# Patient Record
Sex: Female | Born: 1980 | Race: Asian | Hispanic: No | Marital: Married | Smoking: Current some day smoker
Health system: Southern US, Community
[De-identification: ages and names within clinical notes are randomized; demographics above are authoritative.]

## PROBLEM LIST (undated history)

## (undated) DIAGNOSIS — O1495 Unspecified pre-eclampsia, complicating the puerperium: Secondary | ICD-10-CM

## (undated) DIAGNOSIS — I10 Essential (primary) hypertension: Secondary | ICD-10-CM

## (undated) DIAGNOSIS — T8859XA Other complications of anesthesia, initial encounter: Secondary | ICD-10-CM

## (undated) DIAGNOSIS — T4145XA Adverse effect of unspecified anesthetic, initial encounter: Secondary | ICD-10-CM

## (undated) HISTORY — DX: Adverse effect of unspecified anesthetic, initial encounter: T41.45XA

## (undated) HISTORY — DX: Essential (primary) hypertension: I10

## (undated) HISTORY — DX: Unspecified pre-eclampsia, complicating the puerperium: O14.95

## (undated) HISTORY — DX: Other complications of anesthesia, initial encounter: T88.59XA

---

## 2008-03-02 ENCOUNTER — Emergency Department (HOSPITAL_BASED_OUTPATIENT_CLINIC_OR_DEPARTMENT_OTHER): Admission: EM | Admit: 2008-03-02 | Discharge: 2008-03-02 | Payer: Self-pay | Admitting: Emergency Medicine

## 2009-08-30 DIAGNOSIS — N809 Endometriosis, unspecified: Secondary | ICD-10-CM | POA: Insufficient documentation

## 2011-01-08 LAB — URINALYSIS, ROUTINE W REFLEX MICROSCOPIC
Hgb urine dipstick: NEGATIVE
Ketones, ur: NEGATIVE
Specific Gravity, Urine: 1.017
Urobilinogen, UA: 0.2

## 2011-01-08 LAB — DIFFERENTIAL
Basophils Relative: 1
Eosinophils Relative: 1
Lymphocytes Relative: 33
Monocytes Absolute: 0.5
Monocytes Relative: 6
Neutro Abs: 5.1

## 2011-01-08 LAB — CBC
Hemoglobin: 13.2
MCV: 96
Platelets: 205
WBC: 8.5

## 2011-01-08 LAB — POCT I-STAT 3, ART BLOOD GAS (G3+)
Acid-Base Excess: 1
pH, Arterial: 7.574 — ABNORMAL HIGH

## 2011-01-08 LAB — COMPREHENSIVE METABOLIC PANEL
ALT: <6
Alkaline Phosphatase: 35 — ABNORMAL LOW
BUN: 14
CO2: 24
Creatinine, Ser: 0.6
Glucose, Bld: 78
Total Bilirubin: 0.7

## 2011-01-08 LAB — GLUCOSE, CAPILLARY: Glucose-Capillary: 112 — ABNORMAL HIGH

## 2013-06-21 ENCOUNTER — Encounter: Payer: Self-pay | Admitting: Physician Assistant

## 2013-06-21 ENCOUNTER — Encounter (INDEPENDENT_AMBULATORY_CARE_PROVIDER_SITE_OTHER): Payer: Self-pay

## 2013-06-21 ENCOUNTER — Ambulatory Visit (INDEPENDENT_AMBULATORY_CARE_PROVIDER_SITE_OTHER): Payer: Federal, State, Local not specified - PPO | Admitting: Physician Assistant

## 2013-06-21 VITALS — BP 126/87 | HR 83 | Ht 67.0 in | Wt 189.0 lb

## 2013-06-21 DIAGNOSIS — Z131 Encounter for screening for diabetes mellitus: Secondary | ICD-10-CM

## 2013-06-21 DIAGNOSIS — Z6829 Body mass index (BMI) 29.0-29.9, adult: Secondary | ICD-10-CM

## 2013-06-21 DIAGNOSIS — O1003 Pre-existing essential hypertension complicating the puerperium: Secondary | ICD-10-CM | POA: Insufficient documentation

## 2013-06-21 DIAGNOSIS — Z13220 Encounter for screening for lipoid disorders: Secondary | ICD-10-CM

## 2013-06-21 DIAGNOSIS — R7301 Impaired fasting glucose: Secondary | ICD-10-CM

## 2013-06-21 DIAGNOSIS — R635 Abnormal weight gain: Secondary | ICD-10-CM

## 2013-06-21 DIAGNOSIS — IMO0001 Reserved for inherently not codable concepts without codable children: Secondary | ICD-10-CM

## 2013-06-21 MED ORDER — PHENTERMINE HCL 15 MG PO CAPS: 15.0000 mg | ORAL_CAPSULE | ORAL | Status: DC

## 2013-06-21 NOTE — Patient Instructions (Signed)
My fitness pal Lose app

## 2013-06-21 NOTE — Progress Notes (Signed)
   Subjective:    Patient ID: Morgan Miller, female    DOB: 08/24/1980, 33 y.o.   MRN: [id number]  HPI Pt is a 33 yo female who presents to the clinic to establish care.  . Active Ambulatory Problems    Diagnosis Date Noted  . Essential hypertension-postpartum 06/21/2013  . Abnormal weight gain 06/21/2013   Resolved Ambulatory Problems    Diagnosis Date Noted  . No Resolved Ambulatory Problems   Past Medical History  Diagnosis Date  . Hypertension    . History   Social History  . Marital Status: Married    Spouse Name: N/A    Number of Children: N/A  . Years of Education: N/A   Occupational History  . Not on file.   Social History Main Topics  . Smoking status: Current Every Day Smoker  . Smokeless tobacco: Not on file  . Alcohol Use: No  . Drug Use: Not on file  . Sexual Activity: Yes   Other Topics Concern  . Not on file   Social History Narrative  . No narrative on file   . Family History  Problem Relation Age of Onset  . Hyperlipidemia Mother   . Diabetes Father   . Hyperlipidemia Father   . Hypertension Father   . Alcohol abuse Paternal Uncle   . Heart attack Paternal Grandfather    Pt wants to discuss weight gain since last pregnancy over a year ago. Her whole life she has been 125lbs and never had any weight issues. She gained 60lbs with first pregnancy but lost it within a year. This last pregnancy gained 80lbs and has almost not lost any of it in over a year. She admits to not counting calories and does not exercise. She does try to eat fruit, veggies, yogurt but admits to also eating a lot of rice. She has a lot of sugar cravings. Not really fatigued. She has energy. She does smoke everyday. She did have post-partum high blood pressure but has since resolved.    Review of Systems  All other systems reviewed and are negative.       Objective:   Physical Exam  Constitutional: She is oriented to person, place, and time. She appears well-developed  and well-nourished.  Overweight  HENT:  Head: Normocephalic and atraumatic.  Neck: Normal range of motion. Neck supple. No thyromegaly present.  Cardiovascular: Normal rate, regular rhythm and normal heart sounds.   Pulmonary/Chest: Effort normal and breath sounds normal.  Neurological: She is alert and oriented to person, place, and time.  Psychiatric: She has a normal mood and affect. Her behavior is normal.          Assessment & Plan:  Abnormal weight gain- Will check TSH. BMI 29.9. Pt does have risk factor of post-partium HTN. Will start phentermine daily at half dose. Discussed side effects of HR, BP, insomnia. Encouraged loading my fitness pal to log calories. Discussed nutritionist if needed. Add at least 150minutes of cardio into weekly routine. Follow up in one month. May consider phentermine increase if tolerating.     Screening labs ordered.

## 2013-06-25 LAB — COMPLETE METABOLIC PANEL WITH GFR
ALBUMIN: 4.8 g/dL (ref 3.5–5.2)
ALK PHOS: 85 U/L (ref 39–117)
ALT: 14 U/L (ref 0–35)
AST: 12 U/L (ref 0–37)
BILIRUBIN TOTAL: 0.5 mg/dL (ref 0.2–1.2)
BUN: 10 mg/dL (ref 6–23)
CO2: 25 mEq/L (ref 19–32)
Calcium: 10 mg/dL (ref 8.4–10.5)
Chloride: 104 mEq/L (ref 96–112)
Creat: 0.59 mg/dL (ref 0.50–1.10)
GFR, Est African American: >89 mL/min
GLUCOSE: 102 mg/dL — AB (ref 70–99)
POTASSIUM: 4.5 meq/L (ref 3.5–5.3)
Sodium: 138 mEq/L (ref 135–145)
TOTAL PROTEIN: 7.4 g/dL (ref 6.0–8.3)

## 2013-06-25 LAB — LIPID PANEL
Cholesterol: 193 mg/dL (ref 0–200)
HDL: 48 mg/dL (ref >39)
LDL Cholesterol: 121 mg/dL — ABNORMAL HIGH (ref 0–99)
Total CHOL/HDL Ratio: 4 Ratio
Triglycerides: 122 mg/dL (ref <150)
VLDL: 24 mg/dL (ref 0–40)

## 2013-06-25 LAB — VITAMIN B12: Vitamin B-12: 356 pg/mL (ref 211–911)

## 2013-06-25 LAB — TSH: TSH: 1.237 u[IU]/mL (ref 0.350–4.500)

## 2013-06-25 LAB — T4, FREE: Free T4: 1.43 ng/dL (ref 0.80–1.80)

## 2013-06-25 LAB — T3, FREE: T3 FREE: 3 pg/mL (ref 2.3–4.2)

## 2013-06-26 LAB — VITAMIN D 25 HYDROXY (VIT D DEFICIENCY, FRACTURES): Vit D, 25-Hydroxy: 30 ng/mL (ref 30–89)

## 2013-06-28 NOTE — Addendum Note (Signed)
Addended by: Donne AnonBENDER, Delphina Schum L on: 06/28/2013 11:28 AM   Modules accepted: Orders

## 2013-06-29 ENCOUNTER — Encounter: Payer: Self-pay | Admitting: Physician Assistant

## 2013-06-29 DIAGNOSIS — R7303 Prediabetes: Secondary | ICD-10-CM | POA: Insufficient documentation

## 2013-06-29 LAB — HEMOGLOBIN A1C
Hgb A1c MFr Bld: 6.1 % — ABNORMAL HIGH (ref <5.7)
MEAN PLASMA GLUCOSE: 128 mg/dL — AB (ref <117)

## 2013-07-01 ENCOUNTER — Encounter: Payer: Self-pay | Admitting: Physician Assistant

## 2013-07-02 ENCOUNTER — Other Ambulatory Visit: Payer: Self-pay | Admitting: *Deleted

## 2013-07-02 MED ORDER — PHENTERMINE HCL 15 MG PO CAPS: 15.0000 mg | ORAL_CAPSULE | ORAL | Status: DC

## 2013-07-09 ENCOUNTER — Other Ambulatory Visit: Payer: Self-pay | Admitting: Physician Assistant

## 2013-07-23 ENCOUNTER — Ambulatory Visit: Payer: Federal, State, Local not specified - PPO | Admitting: Physician Assistant

## 2013-07-23 ENCOUNTER — Encounter: Payer: Self-pay | Admitting: Physician Assistant

## 2013-07-23 ENCOUNTER — Ambulatory Visit (INDEPENDENT_AMBULATORY_CARE_PROVIDER_SITE_OTHER): Payer: Federal, State, Local not specified - PPO | Admitting: Physician Assistant

## 2013-07-23 VITALS — BP 113/76 | HR 87 | Ht 67.0 in | Wt 176.0 lb

## 2013-07-23 DIAGNOSIS — R635 Abnormal weight gain: Secondary | ICD-10-CM

## 2013-07-23 MED ORDER — PHENTERMINE HCL 15 MG PO CAPS: ORAL_CAPSULE | ORAL | Status: DC

## 2013-07-23 NOTE — Patient Instructions (Signed)
Pre-natal vitamins with DHA.

## 2013-07-23 NOTE — Progress Notes (Signed)
   Subjective:    Patient ID: Morgan Miller, female    DOB: 04/01/1981, 33 y.o.   MRN: [id number]  HPI Pt presents to the clinic to follow up on obesity. She has been on phentermine for 1 month. She denies any insomnia, tachycardia, palpitations. She has lost 12lbs. She is very happy with results. She has made a lot of diet changes with reduction of bread and carbs, she walks 45 minutes a day. She would like to get pregnant in the near future.    Review of Systems     Objective:   Physical Exam  Constitutional: She is oriented to person, place, and time. She appears well-developed and well-nourished.  HENT:  Head: Normocephalic and atraumatic.  Cardiovascular: Normal rate, regular rhythm and normal heart sounds.   Pulmonary/Chest: Effort normal and breath sounds normal.  Neurological: She is alert and oriented to person, place, and time.  Psychiatric: She has a normal mood and affect. Her behavior is normal.          Assessment & Plan:  Abnormal weight gain/overweight- Will refill phentermine for one more month. Continue diet and exercise. Discussed not to get pregnant while on phentermine. Suggest to wait 1 month after stopping medication. I also recommended to start prenatal vitamins now if planning to get pregnant with in the next 3 months. Pt would like to lose 10 more pounds before getting pregnant. BP and heart rate looks great.

## 2013-07-27 ENCOUNTER — Encounter: Payer: Self-pay | Admitting: Physician Assistant

## 2013-07-27 ENCOUNTER — Other Ambulatory Visit: Payer: Self-pay | Admitting: *Deleted

## 2013-07-27 MED ORDER — PHENTERMINE HCL 15 MG PO CAPS: ORAL_CAPSULE | ORAL | Status: DC

## 2013-08-05 ENCOUNTER — Encounter: Payer: Self-pay | Admitting: Physician Assistant

## 2013-08-27 ENCOUNTER — Telehealth: Payer: Self-pay

## 2013-08-27 NOTE — Telephone Encounter (Signed)
Patient called and left a message on nurse line asking for a return call.   Returned Call: Left message asking patient to call back.  

## 2013-09-30 ENCOUNTER — Ambulatory Visit (INDEPENDENT_AMBULATORY_CARE_PROVIDER_SITE_OTHER): Payer: Federal, State, Local not specified - PPO

## 2013-09-30 ENCOUNTER — Ambulatory Visit (INDEPENDENT_AMBULATORY_CARE_PROVIDER_SITE_OTHER): Payer: Federal, State, Local not specified - PPO | Admitting: Obstetrics & Gynecology

## 2013-09-30 ENCOUNTER — Encounter: Payer: Self-pay | Admitting: Obstetrics & Gynecology

## 2013-09-30 VITALS — BP 124/78 | HR 88 | Wt 173.0 lb

## 2013-09-30 DIAGNOSIS — O021 Missed abortion: Secondary | ICD-10-CM

## 2013-09-30 DIAGNOSIS — N912 Amenorrhea, unspecified: Secondary | ICD-10-CM

## 2013-09-30 DIAGNOSIS — O039 Complete or unspecified spontaneous abortion without complication: Secondary | ICD-10-CM

## 2013-09-30 MED ORDER — PROMETHAZINE HCL 25 MG PO TABS: 25.0000 mg | ORAL_TABLET | Freq: Four times a day (QID) | ORAL | Status: DC | PRN

## 2013-09-30 MED ORDER — CODEINE SULFATE 30 MG PO TABS
30.0000 mg | ORAL_TABLET | Freq: Four times a day (QID) | ORAL | Status: DC | PRN
Start: 2013-09-30 — End: 2013-10-01

## 2013-09-30 MED ORDER — IBUPROFEN 800 MG PO TABS: 800.0000 mg | ORAL_TABLET | Freq: Four times a day (QID) | ORAL | Status: DC | PRN

## 2013-09-30 MED ORDER — MISOPROSTOL 200 MCG PO TABS: ORAL_TABLET | ORAL | Status: DC

## 2013-09-30 NOTE — Progress Notes (Signed)
Pt here today for NOB intake for a planned pregnancy  Last pap smear 2 years ago

## 2013-10-01 ENCOUNTER — Encounter: Payer: Self-pay | Admitting: Family Medicine

## 2013-10-01 ENCOUNTER — Encounter: Payer: Self-pay | Admitting: Obstetrics & Gynecology

## 2013-10-01 ENCOUNTER — Ambulatory Visit (INDEPENDENT_AMBULATORY_CARE_PROVIDER_SITE_OTHER): Payer: Federal, State, Local not specified - PPO | Admitting: Family Medicine

## 2013-10-01 ENCOUNTER — Other Ambulatory Visit: Payer: Federal, State, Local not specified - PPO

## 2013-10-01 VITALS — BP 125/86 | HR 90 | Temp 98.0°F | Wt 178.0 lb

## 2013-10-01 DIAGNOSIS — F4321 Adjustment disorder with depressed mood: Secondary | ICD-10-CM

## 2013-10-01 LAB — CBC
HEMATOCRIT: 40.6 % (ref 36.0–46.0)
HEMOGLOBIN: 13.7 g/dL (ref 12.0–15.0)
MCH: 30.6 pg (ref 26.0–34.0)
MCHC: 33.7 g/dL (ref 30.0–36.0)
MCV: 90.8 fL (ref 78.0–100.0)
Platelets: 363 10*3/uL (ref 150–400)
RBC: 4.47 MIL/uL (ref 3.87–5.11)
RDW: 14.2 % (ref 11.5–15.5)
WBC: 10.6 10*3/uL — ABNORMAL HIGH (ref 4.0–10.5)

## 2013-10-01 MED ORDER — ALPRAZOLAM 0.25 MG PO TABS: 0.2500 mg | ORAL_TABLET | Freq: Two times a day (BID) | ORAL | Status: DC | PRN

## 2013-10-01 NOTE — Patient Instructions (Signed)
Call me if you decide to do counseling.

## 2013-10-01 NOTE — Progress Notes (Signed)
Pt has sure LMP and has regular menses.  Pt trying to conceive.  Pt took ovulation predictor then had positive pregnancy test 2+ weeks after ovulation.  Pt having some nausea.  No bleeding.  US shows 6 week 6 day Missed abortion (report in Epic).  Pt counseled about cytotec and would like to proceed.  She does not want a D&C at this time.  Protocol initiated.  Need RH and CBC.  Codeine for pain as allergic to Tylenol.   Pt to come June 26th for labs then proceed with cytotec 800mg  pv.   Total time spent with patient 45 minutes.

## 2013-10-01 NOTE — Progress Notes (Signed)
Subjective:    Patient ID: Morgan Miller, female    DOB: 08/15/1980, 33 y.o.   MRN: [id number]  HPI She is [redacted] weeks pregnant and found out yesterday that she is miscarrying.  She is actually picking up the cytotec today.  She just woke up feeling really shakey and "lost" today. Says can't stop shaking.  She does have a 34 year old son.  She was unable to sleep last night. No worsening or alleviating factors.  She is veyr tearful. Family is supportive.  [facility].    Review of Systems  BP 125/86  Pulse 90  Temp(Src) 98 F (36.7 C)  Wt 178 lb (80.74 kg)  LMP 08/08/2013    Allergies  Allergen Reactions  . Acetaminophen Shortness Of Breath  . Codeine Nausea Only    Past Medical History  Diagnosis Date  . Hypertension   . Complication of anesthesia     epidural caused syncope  . Preeclampsia in postpartum period     Past Surgical History  Procedure Laterality Date  . Cesarean section      History   Social History  . Marital Status: Married    Spouse Name: N/A    Number of Children: N/A  . Years of Education: N/A   Occupational History  . supervisor    Social History Main Topics  . Smoking status: Former Smoker    Quit date: 08/30/2013  . Smokeless tobacco: Not on file  . Alcohol Use: No  . Drug Use: Not on file  . Sexual Activity: Yes   Other Topics Concern  . Not on file   Social History Narrative  . No narrative on file    Family History  Problem Relation Age of Onset  . Hyperlipidemia Mother   . Diabetes Father   . Hyperlipidemia Father   . Hypertension Father   . Alcohol abuse Paternal Uncle   . Heart attack Paternal Grandfather     Outpatient Encounter Prescriptions as of 10/01/2013  Medication Sig  . ibuprofen (ADVIL,MOTRIN) 800 MG tablet Take 1 tablet (800 mg total) by mouth every 6 (six) hours as needed.  . ALPRAZolam (XANAX) 0.25 MG tablet Take 1 tablet (0.25 mg total) by mouth 2 (two) times daily as needed for  anxiety or sleep.  . [DISCONTINUED] codeine 30 MG tablet Take 1 tablet (30 mg total) by mouth every 6 (six) hours as needed.  . [DISCONTINUED] misoprostol (CYTOTEC) 200 MCG tablet Insert four tablets vaginally the night prior to your appointment  . [DISCONTINUED] Prenatal Multivit-Min-Fe-FA (PRENATAL VITAMINS PO) Take by mouth.  . [DISCONTINUED] promethazine (PHENERGAN) 25 MG tablet Take 1 tablet (25 mg total) by mouth every 6 (six) hours as needed for nausea or vomiting.          Objective:   Physical Exam  Constitutional: She is oriented to person, place, and time. She appears well-developed and well-nourished.  HENT:  Head: Normocephalic and atraumatic.  Eyes: Conjunctivae and EOM are normal.  Cardiovascular: Normal rate.   Pulmonary/Chest: Effort normal.  Neurological: She is alert and oriented to person, place, and time.  Skin: Skin is dry. No pallor.  Psychiatric: She has a normal mood and affect. Her behavior is normal.          Assessment & Plan:  Acute grief reaction from failed pregnancy-discussed different options including counseling/therapy versus antidepressant versus when necessary medication. Patient says she will definitely think about counseling and is very open to  it. She would like to call me back after she had chance to think about it. We did discuss that I put her on SSRI daily that we would need to wean off before she tries to become pregnant again. She would like to just have a rescue medication to use as needed. We discussed that she can also use this at night to help her sleep. It can be habit-forming and discuss this as well and to use it judiciously. We'll give her a small quantity of alprazolam 0.25 mg. #15 tabs with no refills.  Failed pregnancy-keep followup appointment with OB/GYN.

## 2013-10-02 LAB — RH TYPE: RH TYPE: POSITIVE

## 2013-10-06 ENCOUNTER — Encounter: Payer: Self-pay | Admitting: *Deleted

## 2013-10-06 ENCOUNTER — Telehealth: Payer: Self-pay | Admitting: *Deleted

## 2013-10-06 DIAGNOSIS — O039 Complete or unspecified spontaneous abortion without complication: Secondary | ICD-10-CM

## 2013-10-06 MED ORDER — HYDROCODONE-IBUPROFEN 5-200 MG PO TABS: ORAL_TABLET | ORAL | Status: DC

## 2013-10-06 NOTE — Telephone Encounter (Signed)
Pt called requesting something other than Codeine for pain as there ar no pharmacies that she has called that carries just Codeine and she is allergic to acetaminophen

## 2013-11-19 ENCOUNTER — Telehealth: Payer: Self-pay | Admitting: *Deleted

## 2013-11-19 NOTE — Telephone Encounter (Signed)
Pt called in stating she had a regular menstrual cycle on 11/06/13 and on 11/17/13 had intercourse and noticed brown vaginal discharge with tissue. Pt states she has bloating today like she is going to have another cycle. I verified that pt has not have fever or any other Sx of infection. Explained to pt that if she noticed bright red bleeding and it is not time for cycle or she begins to have Sx of infection to go to [facility] for evaluation. Pt expressed understanding.

## 2013-11-29 ENCOUNTER — Ambulatory Visit: Payer: Federal, State, Local not specified - PPO | Admitting: Advanced Practice Midwife

## 2013-12-02 ENCOUNTER — Ambulatory Visit: Payer: Federal, State, Local not specified - PPO | Admitting: Obstetrics & Gynecology

## 2014-02-07 ENCOUNTER — Encounter: Payer: Self-pay | Admitting: Family Medicine

## 2014-04-12 ENCOUNTER — Telehealth: Payer: Self-pay | Admitting: *Deleted

## 2014-04-12 NOTE — Telephone Encounter (Signed)
Returned a call to patient but got her voicemail.  Instructed pt to call office if she still needed to speak with me.

## 2014-05-03 ENCOUNTER — Ambulatory Visit (INDEPENDENT_AMBULATORY_CARE_PROVIDER_SITE_OTHER): Payer: Federal, State, Local not specified - PPO | Admitting: Obstetrics & Gynecology

## 2014-05-03 ENCOUNTER — Encounter: Payer: Self-pay | Admitting: Obstetrics & Gynecology

## 2014-05-03 VITALS — BP 131/88 | HR 102 | Resp 16 | Ht 67.0 in | Wt 181.0 lb

## 2014-05-03 DIAGNOSIS — N644 Mastodynia: Secondary | ICD-10-CM

## 2014-05-03 DIAGNOSIS — F419 Anxiety disorder, unspecified: Secondary | ICD-10-CM | POA: Diagnosis not present

## 2014-05-03 LAB — POCT URINE PREGNANCY: PREG TEST UR: NEGATIVE

## 2014-05-03 NOTE — Progress Notes (Signed)
   Subjective:    Patient ID: Morgan Miller, female    DOB: 12/16/1980, 34 y.o.   MRN: [id number]  HPI  34 yo MP1 (35 yo son) who is here today because she is trying to get pregnancy since 9/15. She has been pregnancy twice and had no problem conceiving.  She is taking her PNVs all year. She is having sex and OP kits and is getting a positive surge every month.  Her biggest concern is that she may not be "hormonally balanced " to conceive. However, she has monthly periods. She has recurring symptoms of pregnancy including breast tenderness.  Review of Systems     Objective:   Physical Exam Tearful, sad young lady, some anxiety Breathing normally Neruo- intact UPT- negative       Assessment & Plan:  Reassurance given regarding her chances for conception in the next 12 months I did tell her that I am much more concerned about her apparent anxiety regarding this situation I have recommended that she see Morgan Miller in the near future about this. Check TSH today.

## 2014-05-04 ENCOUNTER — Telehealth: Payer: Self-pay | Admitting: *Deleted

## 2014-05-04 LAB — TSH: TSH: 2.142 u[IU]/mL (ref 0.350–4.500)

## 2014-05-04 NOTE — Telephone Encounter (Signed)
Patient notified of normal TSH levels

## 2014-05-25 ENCOUNTER — Telehealth: Payer: Self-pay | Admitting: Family Medicine

## 2014-05-25 DIAGNOSIS — R509 Fever, unspecified: Secondary | ICD-10-CM

## 2014-05-25 MED ORDER — AZITHROMYCIN 250 MG PO TABS: ORAL_TABLET | ORAL | Status: AC

## 2014-05-25 NOTE — Telephone Encounter (Signed)
Husband was Rxed zpack today, providing one for wife

## 2014-06-01 ENCOUNTER — Ambulatory Visit (INDEPENDENT_AMBULATORY_CARE_PROVIDER_SITE_OTHER): Payer: Federal, State, Local not specified - PPO | Admitting: Physician Assistant

## 2014-06-01 ENCOUNTER — Encounter: Payer: Self-pay | Admitting: Physician Assistant

## 2014-06-01 VITALS — BP 125/74 | HR 108 | Temp 98.1°F | Ht 67.0 in | Wt 181.0 lb

## 2014-06-01 DIAGNOSIS — R6883 Chills (without fever): Secondary | ICD-10-CM

## 2014-06-01 DIAGNOSIS — B349 Viral infection, unspecified: Secondary | ICD-10-CM

## 2014-06-01 DIAGNOSIS — J029 Acute pharyngitis, unspecified: Secondary | ICD-10-CM

## 2014-06-01 DIAGNOSIS — R52 Pain, unspecified: Secondary | ICD-10-CM | POA: Diagnosis not present

## 2014-06-01 DIAGNOSIS — R509 Fever, unspecified: Secondary | ICD-10-CM | POA: Diagnosis not present

## 2014-06-01 DIAGNOSIS — J069 Acute upper respiratory infection, unspecified: Secondary | ICD-10-CM

## 2014-06-01 LAB — POCT RAPID STREP A (OFFICE): Rapid Strep A Screen: NEGATIVE

## 2014-06-01 LAB — POCT INFLUENZA A/B
Influenza A, POC: NEGATIVE
Influenza B, POC: NEGATIVE

## 2014-06-01 NOTE — Patient Instructions (Signed)
Upper Respiratory Infection, Adult An upper respiratory infection (URI) is also sometimes known as the common cold. The upper respiratory tract includes the nose, sinuses, throat, trachea, and bronchi. Bronchi are the airways leading to the lungs. Most people improve within 1 week, but symptoms can last up to 2 weeks. A residual cough may last even longer.  CAUSES Many different viruses can infect the tissues lining the upper respiratory tract. The tissues become irritated and inflamed and often become very moist. Mucus production is also common. A cold is contagious. You can easily spread the virus to others by oral contact. This includes kissing, sharing a glass, coughing, or sneezing. Touching your mouth or nose and then touching a surface, which is then touched by another person, can also spread the virus. SYMPTOMS  Symptoms typically develop 1 to 3 days after you come in contact with a cold virus. Symptoms vary from person to person. They may include:  Runny nose.  Sneezing.  Nasal congestion.  Sinus irritation.  Sore throat.  Loss of voice (laryngitis).  Cough.  Fatigue.  Muscle aches.  Loss of appetite.  Headache.  Low-grade fever. DIAGNOSIS  You might diagnose your own cold based on familiar symptoms, since most people get a cold 2 to 3 times a year. Your caregiver can confirm this based on your exam. Most importantly, your caregiver can check that your symptoms are not due to another disease such as strep throat, sinusitis, pneumonia, asthma, or epiglottitis. Blood tests, throat tests, and X-rays are not necessary to diagnose a common cold, but they may sometimes be helpful in excluding other more serious diseases. Your caregiver will decide if any further tests are required. RISKS AND COMPLICATIONS  You may be at risk for a more severe case of the common cold if you smoke cigarettes, have chronic heart disease (such as heart failure) or lung disease (such as asthma), or if  you have a weakened immune system. The very young and very old are also at risk for more serious infections. Bacterial sinusitis, middle ear infections, and bacterial pneumonia can complicate the common cold. The common cold can worsen asthma and chronic obstructive pulmonary disease (COPD). Sometimes, these complications can require emergency medical care and may be life-threatening. PREVENTION  The best way to protect against getting a cold is to practice good hygiene. Avoid oral or hand contact with people with cold symptoms. Wash your hands often if contact occurs. There is no clear evidence that vitamin C, vitamin E, echinacea, or exercise reduces the chance of developing a cold. However, it is always recommended to get plenty of rest and practice good nutrition. TREATMENT  Treatment is directed at relieving symptoms. There is no cure. Antibiotics are not effective, because the infection is caused by a virus, not by bacteria. Treatment may include:  Increased fluid intake. Sports drinks offer valuable electrolytes, sugars, and fluids.  Breathing heated mist or steam (vaporizer or shower).  Eating chicken soup or other clear broths, and maintaining good nutrition.  Getting plenty of rest.  Using gargles or lozenges for comfort.  Controlling fevers with ibuprofen or acetaminophen as directed by your caregiver.  Increasing usage of your inhaler if you have asthma. Zinc gel and zinc lozenges, taken in the first 24 hours of the common cold, can shorten the duration and lessen the severity of symptoms. Pain medicines may help with fever, muscle aches, and throat pain. A variety of non-prescription medicines are available to treat congestion and runny nose. Your caregiver   can make recommendations and may suggest nasal or lung inhalers for other symptoms.  HOME CARE INSTRUCTIONS   Only take over-the-counter or prescription medicines for pain, discomfort, or fever as directed by your  caregiver.  Use a warm mist humidifier or inhale steam from a shower to increase air moisture. This may keep secretions moist and make it easier to breathe.  Drink enough water and fluids to keep your urine clear or pale yellow.  Rest as needed.  Return to work when your temperature has returned to normal or as your caregiver advises. You may need to stay home longer to avoid infecting others. You can also use a face mask and careful hand washing to prevent spread of the virus. SEEK MEDICAL CARE IF:   After the first few days, you feel you are getting worse rather than better.  You need your caregiver's advice about medicines to control symptoms.  You develop chills, worsening shortness of breath, or brown or red sputum. These may be signs of pneumonia.  You develop yellow or brown nasal discharge or pain in the face, especially when you bend forward. These may be signs of sinusitis.  You develop a fever, swollen neck glands, pain with swallowing, or white areas in the back of your throat. These may be signs of strep throat. SEEK IMMEDIATE MEDICAL CARE IF:   You have a fever.  You develop severe or persistent headache, ear pain, sinus pain, or chest pain.  You develop wheezing, a prolonged cough, cough up blood, or have a change in your usual mucus (if you have chronic lung disease).  You develop sore muscles or a stiff neck. Document Released: 09/18/2000 Document Revised: 06/17/2011 Document Reviewed: 06/30/2013 ExitCare Patient Information 2015 [employer]. This information is not intended to replace advice given to you by your health care provider. Make sure you discuss any questions you have with your health care provider.  

## 2014-06-01 NOTE — Progress Notes (Signed)
   Subjective:    Patient ID: Morgan Miller, female    DOB: 11/28/1980, 34 y.o.   MRN: [id number]  HPI  Patient is a 34 year old female who presents to the clinic with body aches, sore throat, ear pain, chills and fatigue for the last 3-4 days. Sunday she had a terrible headache all day long. She has ran a low-grade fever of 100 201 off and on. Monday and Tuesday she had fever, chills and body aches most of the day. She feels very weak and tired today. She did just take Tylenol and had no fever in office today. She denies any abdominal pain, nausea or constipation. She does admit that her son has had a viral illness last week with high fevers sent to the hospital. he was never treated with antibiotic. he does seem to be getting better.    Review of Systems  All other systems reviewed and are negative.      Objective:   Physical Exam  Constitutional: She is oriented to person, place, and time. She appears well-developed and well-nourished.  HENT:  Head: Normocephalic and atraumatic.  Right Ear: External ear normal.  Left Ear: External ear normal.  Mouth/Throat: Oropharynx is clear and moist.  TM slightly erythematous with no blood or pus present.  Oropharynx slightly erythematous with some postnasal drip present.  Bilateral turbinates red and swollen.  Negative for any maxillary or frontal sinus tenderness.  Eyes: Conjunctivae are normal. Right eye exhibits no discharge. Left eye exhibits no discharge.  Neck: Neck supple.  Right anterior cervical submandibular lymph node slightly swollen and tender to palpation.  Cardiovascular: Normal rate, regular rhythm and normal heart sounds.   Pulmonary/Chest: Effort normal and breath sounds normal. She has no wheezes.  Lymphadenopathy:    She has cervical adenopathy.  Neurological: She is alert and oriented to person, place, and time.  Skin: Skin is dry.  Psychiatric: She has a normal mood and affect. Her behavior is normal.           Assessment & Plan:  Viral illness/acute upper respiratory infection-rapid strep negative today and flu negative today. After physical exam there was no finding suggestive for bacterial causes. Continue to use ibuprofen and Tylenol for any fever or aches and pains. Gave handout on other symptomatic treatments. Encourage rest and hydration. Patient out of work for the rest of the week. Discussed Mucinex is starting to get more congested. May use Afrin for the next 2-5 days short-term as needed for nasal congestion. Certainly if not improving or if worsening please let us know.

## 2014-06-02 ENCOUNTER — Telehealth: Payer: Self-pay | Admitting: *Deleted

## 2014-06-02 ENCOUNTER — Telehealth: Payer: Self-pay | Admitting: Physician Assistant

## 2014-06-02 MED ORDER — AMOXICILLIN-POT CLAVULANATE 875-125 MG PO TABS: 1.0000 | ORAL_TABLET | Freq: Two times a day (BID) | ORAL | Status: DC

## 2014-06-02 NOTE — Telephone Encounter (Signed)
Patient called back request to know if she can have z-pack. Patient stated her husband's doctor here in our office kindly called pt a z-pack in and she was able to take one and her maid accidentally threw the other one out in trash and she wasn't able to fully finish the complete z-pack and that is probably why she is still sick and not better by now. Thanks

## 2014-06-02 NOTE — Telephone Encounter (Signed)
Pt returned call she took the 1st dose only the other pills got thrown away by accident. I asked her did she make Jade aware that she had taken a ABX when she came in she stated that she had forgotten to mention this to her at the time. She states that she is running a fever, sore throat on her R side and her lymph nodes are swollen and her ear and jaw is painful.   Spoke with Dr. Linford ArnoldMetheney and she will send in ABX for pt. Pt advised that if she is not feeling any better to call back. She voiced understanding.Loralee PacasBarkley, Lavel Rieman StonewallLynetta

## 2014-06-02 NOTE — Telephone Encounter (Signed)
Called pt to get more info about how she was feeling.. Was unable to speak w/pt got her vm. lvm asking for return call within the next 5 mins since our office would be closing at 5pm.Leonor Darnell, Martiniqueonya Lynetta

## 2014-06-02 NOTE — Telephone Encounter (Signed)
Pt called stating that her symptoms have gotten worse since her office visit on 06/01/14. She says she is experiencing pressure. She said that you would call an additional medication in if she began to experience pressure. Please advise.

## 2014-07-25 ENCOUNTER — Ambulatory Visit (INDEPENDENT_AMBULATORY_CARE_PROVIDER_SITE_OTHER): Payer: Federal, State, Local not specified - PPO | Admitting: Obstetrics & Gynecology

## 2014-07-25 ENCOUNTER — Encounter (INDEPENDENT_AMBULATORY_CARE_PROVIDER_SITE_OTHER): Payer: Federal, State, Local not specified - PPO | Admitting: Obstetrics & Gynecology

## 2014-07-25 ENCOUNTER — Encounter: Payer: Self-pay | Admitting: Obstetrics & Gynecology

## 2014-07-25 VITALS — BP 135/88 | HR 115 | Wt 183.0 lb

## 2014-07-25 DIAGNOSIS — F411 Generalized anxiety disorder: Secondary | ICD-10-CM

## 2014-07-25 DIAGNOSIS — N912 Amenorrhea, unspecified: Secondary | ICD-10-CM

## 2014-07-25 DIAGNOSIS — Z1151 Encounter for screening for human papillomavirus (HPV): Secondary | ICD-10-CM | POA: Diagnosis not present

## 2014-07-25 DIAGNOSIS — O2691 Pregnancy related conditions, unspecified, first trimester: Secondary | ICD-10-CM | POA: Diagnosis not present

## 2014-07-25 DIAGNOSIS — O3680X1 Pregnancy with inconclusive fetal viability, fetus 1: Secondary | ICD-10-CM

## 2014-07-25 DIAGNOSIS — Z3201 Encounter for pregnancy test, result positive: Secondary | ICD-10-CM | POA: Diagnosis not present

## 2014-07-25 DIAGNOSIS — Z124 Encounter for screening for malignant neoplasm of cervix: Secondary | ICD-10-CM | POA: Diagnosis not present

## 2014-07-26 ENCOUNTER — Telehealth: Payer: Self-pay | Admitting: *Deleted

## 2014-07-26 ENCOUNTER — Encounter: Payer: Self-pay | Admitting: Obstetrics & Gynecology

## 2014-07-26 ENCOUNTER — Encounter: Payer: Self-pay | Admitting: *Deleted

## 2014-07-26 DIAGNOSIS — O2691 Pregnancy related conditions, unspecified, first trimester: Secondary | ICD-10-CM | POA: Insufficient documentation

## 2014-07-26 LAB — HEMOGLOBIN A1C
HEMOGLOBIN A1C: 6 % — AB (ref <5.7)
Mean Plasma Glucose: 126 mg/dL — ABNORMAL HIGH (ref <117)

## 2014-07-26 LAB — HCG, QUANTITATIVE, PREGNANCY: HCG, BETA CHAIN, QUANT, S: 13362.6 m[IU]/mL

## 2014-07-26 NOTE — Telephone Encounter (Signed)
Pt notified of BHCG results.  She will repeat in 48 hours and then repeat u/S in 10 days.  She is going to make appt with her PCP to discuss her Hgb A1C.

## 2014-07-26 NOTE — Progress Notes (Signed)
This encounter was created in error - please disregard.

## 2014-07-26 NOTE — Progress Notes (Signed)
HPI Patient presents for amenorrhea.  Pt's LMP is June 04, 2014.  Pt is not 100% sure of LMP, however she was trying to conceive.  She took an OTC ovulation predictor kit and had times intercourse at positive result.  Pt has not had any vaginal spotting or cramping.  Pt has positive home pregnancy test.   Patient denies further complaints.  She is nervous about having a miscarriage because her last pregnancy ended in miscarriage.  Past Medical History  Diagnosis Date  . Hypertension   . Complication of anesthesia     epidural caused syncope  . Preeclampsia in postpartum period   Prediabetes based on Hgb A1c  ROS Denies fever Denies CP Denies SOB Denies Abdominal pain Denies Vaginal Bleeding  Physical Exam Filed Vitals:   07/25/14 1448  BP: 135/88  Pulse: 115  Weight: 183 lb (83.008 kg)   General:  Tearful, well nourishe HEENT:  NCAT Pulm:  Normal Effort Abd:  Soft, NT, ND Pelvic:  Tanner V, Vagina pink, nml rugae, Cervix closed, uterus NT Ext:  No edema Neuro:  Nml gait Psych:  Anxious, tearful Skin:  Warm, dry  Bedside US shows 5 week 5 day gestational sac.  No adnexal masses.  No yolk sac, no fetal pole  A/P:  34 yo [id number] with amenorrhea and positive pregnancy test  1-Dating--Patient reasonable sure of dates.  US findings concerning but not diagnostic for missed abortion.  Will correlate with b hcg levels today and in 48 hours.  [place] in 10-14 days to confirm viability. 2-Prediabetes--Rpt Hgb A1c.  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  Discussed work up of recurrent miscarriage including thrombophilia work up, PAI-1, possible REI referral. 4-Anxiety--Patinet required short course of xanax with last miscarriage.  Given the possibility of viable IUP, will not give medications.  Counseling and relaxation techniques. 5-RTC 2 days for beta hcg.

## 2014-07-27 ENCOUNTER — Other Ambulatory Visit: Payer: Federal, State, Local not specified - PPO

## 2014-07-27 ENCOUNTER — Ambulatory Visit: Payer: Federal, State, Local not specified - PPO | Admitting: Physician Assistant

## 2014-07-27 DIAGNOSIS — Z3201 Encounter for pregnancy test, result positive: Secondary | ICD-10-CM

## 2014-07-27 LAB — CULTURE, OB URINE: Colony Count: 3000

## 2014-07-27 NOTE — Addendum Note (Signed)
Addended by: Granville LewisLARK, Norrine Ballester L on: 07/27/2014 03:21 PM   Modules accepted: Orders

## 2014-07-28 ENCOUNTER — Telehealth: Payer: Self-pay | Admitting: *Deleted

## 2014-07-28 ENCOUNTER — Other Ambulatory Visit: Payer: Federal, State, Local not specified - PPO

## 2014-07-28 DIAGNOSIS — O2691 Pregnancy related conditions, unspecified, first trimester: Secondary | ICD-10-CM

## 2014-07-28 LAB — HCG, QUANTITATIVE, PREGNANCY: HCG, BETA CHAIN, QUANT, S: 14377.3 m[IU]/mL

## 2014-07-28 NOTE — Telephone Encounter (Signed)
Pt notified of abnormal BHCG results.  She is to have a f/u TVU in 10 days for viability.  Instructed to call if she starts bleeding or to go to [facility] if she is bleeding heavy to where she is changing her pad every 15 minutes or is having severe pelvic pain.

## 2014-08-05 ENCOUNTER — Ambulatory Visit (INDEPENDENT_AMBULATORY_CARE_PROVIDER_SITE_OTHER): Payer: Federal, State, Local not specified - PPO

## 2014-08-05 ENCOUNTER — Other Ambulatory Visit: Payer: Self-pay | Admitting: Obstetrics & Gynecology

## 2014-08-05 DIAGNOSIS — Z3A14 14 weeks gestation of pregnancy: Secondary | ICD-10-CM | POA: Diagnosis not present

## 2014-08-05 DIAGNOSIS — O2620 Pregnancy care for patient with recurrent pregnancy loss, unspecified trimester: Secondary | ICD-10-CM

## 2014-08-05 DIAGNOSIS — O3680X0 Pregnancy with inconclusive fetal viability, not applicable or unspecified: Secondary | ICD-10-CM

## 2014-08-05 DIAGNOSIS — O2691 Pregnancy related conditions, unspecified, first trimester: Secondary | ICD-10-CM

## 2014-08-08 ENCOUNTER — Ambulatory Visit: Payer: Federal, State, Local not specified - PPO | Admitting: Physician Assistant

## 2014-08-09 ENCOUNTER — Telehealth: Payer: Self-pay | Admitting: *Deleted

## 2014-08-09 ENCOUNTER — Other Ambulatory Visit (INDEPENDENT_AMBULATORY_CARE_PROVIDER_SITE_OTHER): Payer: Federal, State, Local not specified - PPO

## 2014-08-09 ENCOUNTER — Encounter: Payer: Self-pay | Admitting: *Deleted

## 2014-08-09 ENCOUNTER — Ambulatory Visit (INDEPENDENT_AMBULATORY_CARE_PROVIDER_SITE_OTHER): Payer: Federal, State, Local not specified - PPO | Admitting: Physician Assistant

## 2014-08-09 ENCOUNTER — Ambulatory Visit: Payer: Federal, State, Local not specified - PPO | Admitting: Physician Assistant

## 2014-08-09 ENCOUNTER — Other Ambulatory Visit: Payer: Self-pay | Admitting: Physician Assistant

## 2014-08-09 ENCOUNTER — Encounter: Payer: Self-pay | Admitting: Physician Assistant

## 2014-08-09 ENCOUNTER — Telehealth: Payer: Self-pay

## 2014-08-09 VITALS — BP 139/85 | HR 89 | Wt 182.0 lb

## 2014-08-09 DIAGNOSIS — R7309 Other abnormal glucose: Secondary | ICD-10-CM

## 2014-08-09 DIAGNOSIS — F411 Generalized anxiety disorder: Secondary | ICD-10-CM

## 2014-08-09 DIAGNOSIS — F419 Anxiety disorder, unspecified: Secondary | ICD-10-CM

## 2014-08-09 DIAGNOSIS — O2691 Pregnancy related conditions, unspecified, first trimester: Secondary | ICD-10-CM

## 2014-08-09 DIAGNOSIS — E663 Overweight: Secondary | ICD-10-CM

## 2014-08-09 DIAGNOSIS — O3680X1 Pregnancy with inconclusive fetal viability, fetus 1: Secondary | ICD-10-CM | POA: Diagnosis not present

## 2014-08-09 DIAGNOSIS — F430 Acute stress reaction: Secondary | ICD-10-CM

## 2014-08-09 DIAGNOSIS — O039 Complete or unspecified spontaneous abortion without complication: Secondary | ICD-10-CM

## 2014-08-09 DIAGNOSIS — R7303 Prediabetes: Secondary | ICD-10-CM

## 2014-08-09 MED ORDER — ACETAMINOPHEN-CODEINE 300-30 MG PO TABS: 1.0000 | ORAL_TABLET | Freq: Four times a day (QID) | ORAL | Status: DC | PRN

## 2014-08-09 MED ORDER — ALPRAZOLAM 0.5 MG PO TABS: 0.5000 mg | ORAL_TABLET | Freq: Two times a day (BID) | ORAL | Status: DC | PRN

## 2014-08-09 MED ORDER — PHENTERMINE HCL 37.5 MG PO TABS: 37.5000 mg | ORAL_TABLET | Freq: Every day | ORAL | Status: DC

## 2014-08-09 NOTE — Progress Notes (Signed)
Done

## 2014-08-09 NOTE — Telephone Encounter (Signed)
Patient had a miscarriage Saturday. She is having terrible cramps and ibuprofen and tylenol is not helping. She would like Tylenol with codeine. Please advise.

## 2014-08-09 NOTE — Telephone Encounter (Signed)
Ok to give tylenol just encourage pt with continual pain I would want to get an Ultrasound to make sure nothing else is going on.

## 2014-08-09 NOTE — Telephone Encounter (Signed)
Pt called stating that she started bleeding and passing clots with cramping on Saturday.  Per patient she feels like she has passed the fetal tissue.  She will come by today for f/u BHCG.

## 2014-08-09 NOTE — Telephone Encounter (Signed)
We have on your allergy list nausea with codiene and SOB with tylenol. Is this true? Did OB do an ultrasound. A little concerned that cramps are continuing at this intensity.

## 2014-08-09 NOTE — Telephone Encounter (Signed)
She would like to have an ultra sound.

## 2014-08-09 NOTE — Progress Notes (Signed)
   Subjective:    Patient ID: Morgan Miller, female    DOB: 11/21/1980, 34 y.o.   MRN: [id number]  HPI  Pt presents to the clinic after 2nd miscarriage that happened on Saturday, 4 days ago. Hx of over 1 year to concieve.She does have one 513 and 281/35 year old healthy son. She called this morning because she is still having some painful cramping. Given tylenol with codiene and helping. She has not had ultrasound since miscarriage. She did not take anything to start miscarriage and have not suggested DNC. Per pt she was told miscarriage could be due to pre-diabetes. A1C was 6.0. She is very concerned and wants to go to fertility specialist and work on diet and weight loss. She does admit to being very sad, concerned, overwhelmed and anxious. She had xanax at last miscarriage and would like to have something if she needs it.    Review of Systems  All other systems reviewed and are negative.      Objective:   Physical Exam  Constitutional: She appears well-developed and well-nourished.  Cardiovascular: Normal rate, regular rhythm and normal heart sounds.   Pulmonary/Chest: Effort normal and breath sounds normal.  Psychiatric:  Very tearful.          Assessment & Plan:  Miscarriage- continue to use short supply of tylenol/codiene for cramps. Consider warm compresses and ibuprofen as well. If still having severe cramps after 1 week may need repeat ultrasound. Will refer to fertility specialist as 2nd miscarriage and took over 1 year to get pregnant.   Anxiety acute stress- small quanity of xanax given to use as needed. Abuse potential discussed. Follow up with finding the need to use xanax on a regular basis may need to consider daily medication.pt aware not use xanax while pregnant.    Pre-diabetes/overweight- a1c 6.0. Discussed proper diabetic nutrition. Offered a referral to nutritionist but declined today. Discussed we could start metformin but she would like to talk to fertility specialist  first.She would like to go back on phentermine. I do think a little soon to focus on weight loss. Handed prescription but advised to give a few weeks to grieve and get mindset right before working or weight loss. Discussed side effects of phentermine and monitoring. Can make anxiety worse. Follow up in 1 month after start. Pt is aware NOT to get pregnant this is very harmful to fetus.   Spent 30 minutes with patient and greater than 50 percent of visit spent counseling patient.

## 2014-08-09 NOTE — Progress Notes (Unsigned)
Please confirm that patient can tolerate codiene/tylenol via allergy list.

## 2014-08-09 NOTE — Telephone Encounter (Signed)
She wants to switch OB. She states she doesn't have a problem taking tylenol with CDN.

## 2014-08-10 ENCOUNTER — Ambulatory Visit: Payer: Federal, State, Local not specified - PPO | Admitting: Physician Assistant

## 2014-08-10 ENCOUNTER — Telehealth: Payer: Self-pay | Admitting: *Deleted

## 2014-08-10 DIAGNOSIS — F430 Acute stress reaction: Secondary | ICD-10-CM

## 2014-08-10 DIAGNOSIS — F411 Generalized anxiety disorder: Secondary | ICD-10-CM | POA: Insufficient documentation

## 2014-08-10 DIAGNOSIS — E663 Overweight: Secondary | ICD-10-CM | POA: Insufficient documentation

## 2014-08-10 LAB — HCG, QUANTITATIVE, PREGNANCY: HCG, BETA CHAIN, QUANT, S: 6276.2 m[IU]/mL

## 2014-08-10 NOTE — Telephone Encounter (Signed)
LM on voicemail of falling BHCG and needs Rpt in 2 weeks.

## 2014-08-16 ENCOUNTER — Telehealth: Payer: Self-pay | Admitting: *Deleted

## 2014-08-16 NOTE — Telephone Encounter (Signed)
Rtn'd pt call about taking supplements - LMOM for pt to rtn call if needed

## 2014-08-23 ENCOUNTER — Other Ambulatory Visit: Payer: Federal, State, Local not specified - PPO

## 2014-08-25 ENCOUNTER — Encounter: Payer: Self-pay | Admitting: Obstetrics & Gynecology

## 2014-08-25 ENCOUNTER — Encounter: Payer: Self-pay | Admitting: *Deleted

## 2014-08-25 ENCOUNTER — Ambulatory Visit (INDEPENDENT_AMBULATORY_CARE_PROVIDER_SITE_OTHER): Payer: Federal, State, Local not specified - PPO | Admitting: Obstetrics & Gynecology

## 2014-08-25 ENCOUNTER — Telehealth: Payer: Self-pay | Admitting: *Deleted

## 2014-08-25 VITALS — BP 130/92 | HR 107 | Resp 16 | Ht 67.0 in | Wt 176.0 lb

## 2014-08-25 DIAGNOSIS — O039 Complete or unspecified spontaneous abortion without complication: Secondary | ICD-10-CM

## 2014-08-25 NOTE — Progress Notes (Signed)
   Subjective:    Patient ID: Morgan Miller, female    DOB: 06/07/1980, 34 y.o.   MRN: [id number]  HPI 34 yo W lady here in the process of a miscarriage (for the last 3 weeks). Her bleeding has been heavier yesterday morning and this morning, but stops during the day. Denies pain or fever.   Review of Systems     Objective:   Physical Exam [id number] Breathing and ambulating normally Abd- benign Bedside u/s shows an empty uterus       Assessment & Plan:  Probable complete miscarriage Check QBHCG and CBC Rec condoms for 3 months Add OTC iron to PNVs

## 2014-08-25 NOTE — Telephone Encounter (Signed)
Pt called in stating she has started heavy vaginal bleeding with clots, filling a pad in about 30 minutes.

## 2014-08-26 ENCOUNTER — Telehealth: Payer: Self-pay | Admitting: *Deleted

## 2014-08-26 LAB — CBC
HCT: 34.4 % — ABNORMAL LOW (ref 36.0–46.0)
HEMOGLOBIN: 11.4 g/dL — AB (ref 12.0–15.0)
MCH: 30 pg (ref 26.0–34.0)
MCHC: 33.1 g/dL (ref 30.0–36.0)
MCV: 90.5 fL (ref 78.0–100.0)
MPV: 9.2 fL (ref 8.6–12.4)
PLATELETS: 403 10*3/uL — AB (ref 150–400)
RBC: 3.8 MIL/uL — AB (ref 3.87–5.11)
RDW: 14.2 % (ref 11.5–15.5)
WBC: 14.8 10*3/uL — AB (ref 4.0–10.5)

## 2014-08-26 LAB — HCG, QUANTITATIVE, PREGNANCY: HCG, BETA CHAIN, QUANT, S: 529.6 m[IU]/mL

## 2014-08-26 NOTE — Telephone Encounter (Signed)
LM on voicemail of CBC results and her declining BHCG.  She is to repeat the HCG in 3 weeks.

## 2014-09-22 ENCOUNTER — Ambulatory Visit (INDEPENDENT_AMBULATORY_CARE_PROVIDER_SITE_OTHER): Payer: Federal, State, Local not specified - PPO | Admitting: Sports Medicine

## 2014-09-22 VITALS — BP 127/87 | HR 87 | Wt 171.0 lb

## 2014-09-22 DIAGNOSIS — E663 Overweight: Secondary | ICD-10-CM

## 2014-09-22 MED ORDER — PHENTERMINE HCL 37.5 MG PO TABS: 37.5000 mg | ORAL_TABLET | Freq: Every day | ORAL | Status: DC

## 2014-09-22 NOTE — Assessment & Plan Note (Signed)
Good weight loss. 

## 2014-09-22 NOTE — Progress Notes (Signed)
   Subjective:    Patient ID: Morgan Miller, female    DOB: [birth year], 34 y.o.   MRN: [id number]  HPI Patient is here for blood pressure and weight check. Denies any trouble sleeping, palpitations, or any other medication problems. Pt reports making dietary changes along with the Rx to help with her weight loss.   Review of Systems     Objective:   Physical Exam        Assessment & Plan:  Patient has lost weight. A refill for Phentermine will be sent to patient preferred pharmacy. Patient advised to schedule a four week nurse visit and keep her upcoming appointment with her PCP. Verbalized understanding, no further questions.

## 2014-10-07 ENCOUNTER — Other Ambulatory Visit: Payer: Self-pay | Admitting: Physician Assistant

## 2014-10-07 DIAGNOSIS — R7301 Impaired fasting glucose: Secondary | ICD-10-CM

## 2014-10-13 ENCOUNTER — Other Ambulatory Visit: Payer: Self-pay | Admitting: Physician Assistant

## 2014-10-13 ENCOUNTER — Ambulatory Visit (INDEPENDENT_AMBULATORY_CARE_PROVIDER_SITE_OTHER): Payer: Federal, State, Local not specified - PPO | Admitting: Obstetrics & Gynecology

## 2014-10-13 ENCOUNTER — Encounter: Payer: Self-pay | Admitting: Obstetrics & Gynecology

## 2014-10-13 VITALS — BP 126/83 | HR 100 | Resp 16 | Ht 67.0 in | Wt 166.0 lb

## 2014-10-13 DIAGNOSIS — N63 Unspecified lump in breast: Secondary | ICD-10-CM | POA: Diagnosis not present

## 2014-10-13 DIAGNOSIS — Z01411 Encounter for gynecological examination (general) (routine) with abnormal findings: Secondary | ICD-10-CM | POA: Diagnosis not present

## 2014-10-13 DIAGNOSIS — Z124 Encounter for screening for malignant neoplasm of cervix: Secondary | ICD-10-CM | POA: Diagnosis not present

## 2014-10-13 DIAGNOSIS — Z01419 Encounter for gynecological examination (general) (routine) without abnormal findings: Secondary | ICD-10-CM

## 2014-10-13 DIAGNOSIS — Z1151 Encounter for screening for human papillomavirus (HPV): Secondary | ICD-10-CM

## 2014-10-13 DIAGNOSIS — N6320 Unspecified lump in the left breast, unspecified quadrant: Secondary | ICD-10-CM

## 2014-10-13 NOTE — Progress Notes (Signed)
  Subjective:     Morgan Miller is a 34 y.o. female here for a routine exam.  Current complaints: left breast mass and pain.  No discharge.  Personal health questionnaire reviewed: yes. Lost 20 pounds with diet.   Gynecologic History Patient's last menstrual period was 09/29/2014. Contraception: none  Undergoind fertility evaluation Last Pap: >3 years. Results were: normal per pt   Obstetric History OB History  Gravida Para Term Preterm AB SAB TAB Ectopic Multiple Living  4 2 2  1 1    2     # Outcome Date GA Lbr Len/2nd Weight Sex Delivery Anes PTL Lv  4 Gravida           3 SAB         FD  2 Term     M CS-Unspec EPI N Y  1 Term     M CS-Unspec EPI N Y     Comments: up for adoption       The following portions of the patient's history were reviewed and updated as appropriate: allergies, current medications, past family history, past medical history, past social history, past surgical history and problem list.  Review of Systems Pertinent items are noted in HPI.    Objective:      Filed Vitals:   10/13/14 1503  BP: 126/83  Pulse: 100  Resp: 16  Height: 5\' 7"  (1.702 m)  Weight: 166 lb (75.297 kg)   Vitals:  WNL General appearance: alert, cooperative and no distress Head: Normocephalic, without obvious abnormality, atraumatic Eyes: negative Throat: lips, mucosa, and tongue normal; teeth and gums normal Lungs: clear to auscultation bilaterally Breasts: normal appearance, left breast thickening Heart: regular rate and rhythm Abdomen: soft, non-tender; bowel sounds normal; no masses,  no organomegaly  Pelvic:  External Genitalia:  Tanner V, no lesion Urethra:  No prolapse Vagina:  Pink, normal rugae, no blood or discharge Cervix:  No CMT, no lesion Uterus:  Normal size and contour, non tender Adnexa:  Normal, no masses, non tender  Extremities: no edema, redness or tenderness in the calves or thighs Skin: no lesions or rash Lymph nodes: Axillary adenopathy: none         Assessment:    Healthy female exam.   Left breast mass   Plan:    Education reviewed: low fat, low cholesterol diet, self breast exams and weight bearing exercise. Contraception: none. Follow up in: 1 year. Left breast US  F/U with [facility] infertility.

## 2014-10-14 ENCOUNTER — Other Ambulatory Visit: Payer: Federal, State, Local not specified - PPO

## 2014-10-18 LAB — CYTOLOGY - PAP

## 2014-10-21 ENCOUNTER — Ambulatory Visit
Admission: RE | Admit: 2014-10-21 | Discharge: 2014-10-21 | Disposition: A | Discharge status: Home / Self Care | Payer: Federal, State, Local not specified - PPO | Source: Ambulatory Visit | Attending: Sports Medicine | Admitting: Sports Medicine

## 2014-10-21 ENCOUNTER — Other Ambulatory Visit: Payer: Self-pay | Admitting: Obstetrics & Gynecology

## 2014-10-21 ENCOUNTER — Ambulatory Visit
Admission: RE | Admit: 2014-10-21 | Discharge: 2014-10-21 | Disposition: A | Discharge status: Home / Self Care | Payer: Federal, State, Local not specified - PPO | Source: Ambulatory Visit | Attending: Obstetrics & Gynecology | Admitting: Obstetrics & Gynecology

## 2014-10-21 ENCOUNTER — Other Ambulatory Visit: Payer: Federal, State, Local not specified - PPO

## 2014-10-21 ENCOUNTER — Other Ambulatory Visit: Payer: Self-pay | Admitting: Sports Medicine

## 2014-10-21 DIAGNOSIS — N6320 Unspecified lump in the left breast, unspecified quadrant: Secondary | ICD-10-CM

## 2014-11-17 ENCOUNTER — Other Ambulatory Visit: Payer: Self-pay | Admitting: Physician Assistant

## 2014-12-14 ENCOUNTER — Other Ambulatory Visit: Payer: Self-pay | Admitting: Family Medicine

## 2015-01-02 ENCOUNTER — Other Ambulatory Visit: Payer: Self-pay | Admitting: Physician Assistant

## 2015-01-08 ENCOUNTER — Other Ambulatory Visit: Payer: Self-pay | Admitting: Physician Assistant

## 2015-02-08 ENCOUNTER — Other Ambulatory Visit: Payer: Self-pay | Admitting: Physician Assistant

## 2015-02-16 ENCOUNTER — Other Ambulatory Visit: Payer: Self-pay | Admitting: Physician Assistant

## 2015-02-21 ENCOUNTER — Ambulatory Visit: Payer: Federal, State, Local not specified - PPO | Admitting: Physician Assistant

## 2015-02-22 ENCOUNTER — Ambulatory Visit (INDEPENDENT_AMBULATORY_CARE_PROVIDER_SITE_OTHER): Payer: Federal, State, Local not specified - PPO | Admitting: Physician Assistant

## 2015-02-22 ENCOUNTER — Encounter: Payer: Self-pay | Admitting: Physician Assistant

## 2015-02-22 VITALS — BP 122/84 | HR 86 | Ht 67.0 in | Wt 165.0 lb

## 2015-02-22 DIAGNOSIS — R635 Abnormal weight gain: Secondary | ICD-10-CM | POA: Diagnosis not present

## 2015-02-22 DIAGNOSIS — E663 Overweight: Secondary | ICD-10-CM | POA: Diagnosis not present

## 2015-02-22 MED ORDER — ALPRAZOLAM 0.5 MG PO TABS: 0.5000 mg | ORAL_TABLET | Freq: Two times a day (BID) | ORAL | Status: DC | PRN

## 2015-02-22 MED ORDER — PHENTERMINE HCL 37.5 MG PO TABS: 37.5000 mg | ORAL_TABLET | Freq: Every day | ORAL | Status: DC

## 2015-02-22 NOTE — Progress Notes (Signed)
   Subjective:    Patient ID: Morgan Miller, female    DOB: 04/24/1980, 34 y.o.   MRN: [id number]  HPI Patient is a 34 year old female who presents to the clinic to restart phentermine for weight loss. The last time she was on phentermine was July. She has lost overall 10 pounds since May of this year. She is exercising and watching her diet. 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 She is on progesterone. She has had no luck. She would like to take a break from this and just focus on herself. She denies any previous side effects to phentermine.   Review of Systems  All other systems reviewed and are negative.      Objective:   Physical Exam  Constitutional: She appears well-developed and well-nourished.  Cardiovascular: Normal rate, regular rhythm and normal heart sounds.   Pulmonary/Chest: Effort normal and breath sounds normal.  Psychiatric: She has a normal mood and affect. Her behavior is normal.          Assessment & Plan:  Abnormal weight gain/overweight-recheck blood pressure in office and had normalized. patient agrees to hold on trying to conceive. She confirmed she will use protection while on phentermine. Certainly what she finishes phentermine she can go off protection. Encouraged diet changes and exercise. Follow-up in one month for blood pressure and weight check.

## 2015-02-24 ENCOUNTER — Ambulatory Visit: Payer: Self-pay | Admitting: Physician Assistant

## 2015-02-27 ENCOUNTER — Telehealth: Payer: Self-pay | Admitting: Physician Assistant

## 2015-02-27 NOTE — Telephone Encounter (Signed)
Stop phentermine this can worsen anxiety.  We can put in a referral for psych but will take time.  I can start medication come in for OV let's start and can follow up with psych if needed.

## 2015-02-27 NOTE — Telephone Encounter (Signed)
Pt called today and would like to see a psychiatrist ASAP .Marland Kitchen. She feels like she is going to have a nervous breakdown.

## 2015-02-27 NOTE — Telephone Encounter (Signed)
Spoke with pt & she stated that she has not taken phentermine for the last 4 days & she agreed to come in.  She was transferred to scheduling.

## 2015-03-06 ENCOUNTER — Ambulatory Visit (INDEPENDENT_AMBULATORY_CARE_PROVIDER_SITE_OTHER): Payer: Federal, State, Local not specified - PPO | Admitting: Physician Assistant

## 2015-03-06 ENCOUNTER — Other Ambulatory Visit: Payer: Self-pay | Admitting: Physician Assistant

## 2015-03-06 ENCOUNTER — Encounter: Payer: Self-pay | Admitting: Physician Assistant

## 2015-03-06 VITALS — BP 137/94 | HR 105 | Ht 67.0 in | Wt 162.0 lb

## 2015-03-06 DIAGNOSIS — F430 Acute stress reaction: Principal | ICD-10-CM

## 2015-03-06 DIAGNOSIS — J208 Acute bronchitis due to other specified organisms: Secondary | ICD-10-CM

## 2015-03-06 DIAGNOSIS — F411 Generalized anxiety disorder: Secondary | ICD-10-CM

## 2015-03-06 DIAGNOSIS — G4700 Insomnia, unspecified: Secondary | ICD-10-CM | POA: Diagnosis not present

## 2015-03-06 DIAGNOSIS — F419 Anxiety disorder, unspecified: Secondary | ICD-10-CM | POA: Diagnosis not present

## 2015-03-06 MED ORDER — TRAZODONE HCL 50 MG PO TABS: 25.0000 mg | ORAL_TABLET | Freq: Every evening | ORAL | Status: DC | PRN

## 2015-03-06 MED ORDER — GUAIFENESIN-CODEINE 100-10 MG/5ML PO SYRP: 5.0000 mL | ORAL_SOLUTION | Freq: Three times a day (TID) | ORAL | Status: DC | PRN

## 2015-03-06 MED ORDER — FLUOXETINE HCL 20 MG PO TABS: 20.0000 mg | ORAL_TABLET | Freq: Every day | ORAL | Status: DC

## 2015-03-07 ENCOUNTER — Ambulatory Visit (INDEPENDENT_AMBULATORY_CARE_PROVIDER_SITE_OTHER): Payer: Federal, State, Local not specified - PPO | Admitting: Psychiatry

## 2015-03-07 ENCOUNTER — Encounter (HOSPITAL_COMMUNITY): Payer: Self-pay | Admitting: Psychiatry

## 2015-03-07 ENCOUNTER — Encounter: Payer: Self-pay | Admitting: Physician Assistant

## 2015-03-07 VITALS — BP 138/86 | HR 98 | Ht 67.0 in | Wt 162.0 lb

## 2015-03-07 DIAGNOSIS — F331 Major depressive disorder, recurrent, moderate: Secondary | ICD-10-CM | POA: Diagnosis not present

## 2015-03-07 DIAGNOSIS — F4310 Post-traumatic stress disorder, unspecified: Secondary | ICD-10-CM

## 2015-03-07 DIAGNOSIS — F430 Acute stress reaction: Secondary | ICD-10-CM | POA: Insufficient documentation

## 2015-03-07 DIAGNOSIS — G4700 Insomnia, unspecified: Secondary | ICD-10-CM | POA: Insufficient documentation

## 2015-03-07 DIAGNOSIS — F410 Panic disorder [episodic paroxysmal anxiety] without agoraphobia: Secondary | ICD-10-CM

## 2015-03-07 DIAGNOSIS — F900 Attention-deficit hyperactivity disorder, predominantly inattentive type: Secondary | ICD-10-CM

## 2015-03-07 MED ORDER — AMPHETAMINE-DEXTROAMPHETAMINE 5 MG PO TABS: 5.0000 mg | ORAL_TABLET | Freq: Every day | ORAL | Status: DC
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
## 2015-03-07 NOTE — Progress Notes (Signed)
   Subjective:    Patient ID: Morgan Miller, female    DOB: 11/05/1980, 34 y.o.   MRN: [id number]  HPI  Pt presents to the clinic with 2-3 days of cough and upper respiratory symptoms. She is also very emotional and feels like "her life is falling apart".   URI symptoms started 2-3 days ago. No fever or chills. Mostly cough that is worse at night. No wheezing. Not tried anything OTC. No sinus pressure.   Last week she "lost it". She wanted to do something for herself and go out with a friend and her mother called her "selfish". She emotionally broke. She has felt unsupportive by her husband for years and her mother has never been there for her. She feels she can never do enough for anyone to make anyone proud. She does have a 35 year old sone who is her life but she does everything for him. Her and her husband rarely see each other and sleep in separate rooms. Last Monday she just left and went to her brothers house. She honestly can not even remember packing. She just finds herself crying, not sleeping, and sometimes even pacing.  She has been off prozac for years but wants to go back on it. She has had 2 miscarriages over past few years and that has taken it's toll on her. She denies any suicidal  or homicidal thoughts. She just wants to stop crying. She cannot focus on anything and feels like she cannot perform her job right now.     Review of Systems See HPI.    Objective:   Physical Exam  Constitutional: She is oriented to person, place, and time. She appears well-developed and well-nourished.  HENT:  Head: Normocephalic and atraumatic.  Right Ear: External ear normal.  Left Ear: External ear normal.  Mouth/Throat: Oropharynx is clear and moist. No oropharyngeal exudate.  TM's clear bilaterally.  Rhinorrhea present with redness around opening of nares.  Negative for any maxillary or frontal sinus tenderness to palpation.   Eyes: Conjunctivae are normal.  Bilateral injected conjunctiva. Pt  is actively crying.   Neck: Normal range of motion. Neck supple.  Cardiovascular: Regular rhythm and normal heart sounds.   Tachycardia at 105.   Pulmonary/Chest: Effort normal and breath sounds normal. She has no wheezes.  Lymphadenopathy:    She has no cervical adenopathy.  Neurological: She is alert and oriented to person, place, and time.  Psychiatric:  Very tearful. Flat affect. Could not maintain confluent thoughts.           Assessment & Plan:  Acute bronchitis- symptoms consistent with virus. I certainly feel like stress she is under is making her more septable to infection. Give cheratussin for cough. Discussed mucinex OTC. Rest and hydrate. If symptoms worsening call office and will consider abx.   Emotional crisis due to stress/acute stress reaction- pt has BH appt downstairs Dec.9th. Pt needs to see psychiatrist and counselor to help deal with the hurt and her perception. She has been on prozac before. Restarted today. She is aware this does take time to work. Pt already has xanax and discussed to only use as needed. Abuse warning given. I did write patient out of work until she sees psychiatrist.   Insomnia- likely if we get anxiety decreased this will improve. Trazodone given for bedtime. Side effects discussed.   Spent 30 minutes with patient and greater than 50 percent of visit spent counseling patient regarding emotional crisis.

## 2015-03-13 ENCOUNTER — Telehealth: Payer: Self-pay

## 2015-03-13 MED ORDER — CITALOPRAM HYDROBROMIDE 10 MG PO TABS: 10.0000 mg | ORAL_TABLET | Freq: Every day | ORAL | Status: DC

## 2015-03-13 NOTE — Telephone Encounter (Signed)
Patient advised. Medication sent in.

## 2015-03-13 NOTE — Telephone Encounter (Signed)
Ok to send celexa 10mg  one tablet daily #30 NRF

## 2015-03-13 NOTE — Telephone Encounter (Signed)
Patient states she is having stomach aches, nausea and diarrhea from Prozac. She is also having increased anxiety. She would like to try Celexa. Please advise.

## 2015-03-14 ENCOUNTER — Telehealth (HOSPITAL_COMMUNITY): Payer: Self-pay | Admitting: *Deleted

## 2015-03-14 NOTE — Telephone Encounter (Signed)
Prior authorization for adderall received. Called [phone] spoke with Thyra Breednrique who gave approval from 01/13/15-03/13/16. Called to notify pharmacy.

## 2015-03-17 ENCOUNTER — Encounter (HOSPITAL_COMMUNITY): Payer: Self-pay | Admitting: Psychiatry

## 2015-03-17 ENCOUNTER — Ambulatory Visit (INDEPENDENT_AMBULATORY_CARE_PROVIDER_SITE_OTHER): Payer: Federal, State, Local not specified - PPO | Admitting: Psychiatry

## 2015-03-17 VITALS — BP 126/64 | HR 95 | Ht 67.0 in | Wt 162.0 lb

## 2015-03-17 DIAGNOSIS — F4310 Post-traumatic stress disorder, unspecified: Secondary | ICD-10-CM | POA: Diagnosis not present

## 2015-03-17 DIAGNOSIS — F410 Panic disorder [episodic paroxysmal anxiety] without agoraphobia: Secondary | ICD-10-CM | POA: Diagnosis not present

## 2015-03-17 DIAGNOSIS — F900 Attention-deficit hyperactivity disorder, predominantly inattentive type: Secondary | ICD-10-CM | POA: Diagnosis not present

## 2015-03-17 DIAGNOSIS — F331 Major depressive disorder, recurrent, moderate: Secondary | ICD-10-CM

## 2015-03-17 MED ORDER — CITALOPRAM HYDROBROMIDE 10 MG PO TABS: 10.0000 mg | ORAL_TABLET | Freq: Every day | ORAL | Status: DC

## 2015-03-17 MED ORDER — AMPHETAMINE-DEXTROAMPHETAMINE 5 MG PO TABS: 5.0000 mg | ORAL_TABLET | Freq: Two times a day (BID) | ORAL | Status: DC
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
## 2015-03-22 ENCOUNTER — Telehealth: Payer: Self-pay

## 2015-03-22 ENCOUNTER — Other Ambulatory Visit: Payer: Self-pay | Admitting: *Deleted

## 2015-03-22 ENCOUNTER — Ambulatory Visit (HOSPITAL_COMMUNITY): Payer: Federal, State, Local not specified - PPO | Admitting: Licensed Clinical Social Worker

## 2015-03-22 DIAGNOSIS — R05 Cough: Secondary | ICD-10-CM

## 2015-03-22 DIAGNOSIS — R059 Cough, unspecified: Secondary | ICD-10-CM

## 2015-03-22 NOTE — Telephone Encounter (Signed)
Patient states she has had a cough for 3 weeks and would like a chest xray. She is Jade's patient. Please advise.

## 2015-03-22 NOTE — Telephone Encounter (Signed)
Okay to order chest x-ray 2 view.

## 2015-03-22 NOTE — Telephone Encounter (Signed)
Ordered chest xray and lmsg for [facility]

## 2015-04-11 ENCOUNTER — Other Ambulatory Visit: Payer: Self-pay | Admitting: Physician Assistant

## 2015-04-12 ENCOUNTER — Encounter (HOSPITAL_COMMUNITY): Payer: Self-pay | Admitting: Licensed Clinical Social Worker

## 2015-04-12 ENCOUNTER — Ambulatory Visit (INDEPENDENT_AMBULATORY_CARE_PROVIDER_SITE_OTHER): Payer: Federal, State, Local not specified - PPO | Admitting: Licensed Clinical Social Worker

## 2015-04-12 DIAGNOSIS — F4310 Post-traumatic stress disorder, unspecified: Secondary | ICD-10-CM

## 2015-04-12 DIAGNOSIS — F410 Panic disorder [episodic paroxysmal anxiety] without agoraphobia: Secondary | ICD-10-CM

## 2015-04-12 DIAGNOSIS — F331 Major depressive disorder, recurrent, moderate: Secondary | ICD-10-CM | POA: Diagnosis not present

## 2015-04-12 DIAGNOSIS — F900 Attention-deficit hyperactivity disorder, predominantly inattentive type: Secondary | ICD-10-CM
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
## 2015-04-14 ENCOUNTER — Encounter (HOSPITAL_COMMUNITY): Payer: Self-pay | Admitting: Psychiatry

## 2015-04-14 ENCOUNTER — Ambulatory Visit (INDEPENDENT_AMBULATORY_CARE_PROVIDER_SITE_OTHER): Payer: Federal, State, Local not specified - PPO | Admitting: Psychiatry

## 2015-04-14 VITALS — BP 127/84 | HR 100 | Wt 160.0 lb

## 2015-04-14 DIAGNOSIS — F410 Panic disorder [episodic paroxysmal anxiety] without agoraphobia: Secondary | ICD-10-CM

## 2015-04-14 DIAGNOSIS — F4310 Post-traumatic stress disorder, unspecified: Secondary | ICD-10-CM | POA: Diagnosis not present

## 2015-04-14 DIAGNOSIS — F331 Major depressive disorder, recurrent, moderate: Secondary | ICD-10-CM

## 2015-04-14 DIAGNOSIS — F900 Attention-deficit hyperactivity disorder, predominantly inattentive type: Secondary | ICD-10-CM

## 2015-04-14 MED ORDER — AMPHETAMINE-DEXTROAMPHETAMINE 5 MG PO TABS: 5.0000 mg | ORAL_TABLET | Freq: Three times a day (TID) | ORAL | Status: DC

## 2015-04-14 MED ORDER — CITALOPRAM HYDROBROMIDE 10 MG PO TABS: 10.0000 mg | ORAL_TABLET | Freq: Every day | ORAL | Status: DC
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
## 2015-04-17 ENCOUNTER — Ambulatory Visit (HOSPITAL_COMMUNITY): Payer: Federal, State, Local not specified - PPO | Admitting: Psychiatry

## 2015-04-18 ENCOUNTER — Other Ambulatory Visit: Payer: Self-pay | Admitting: Physician Assistant

## 2015-04-18 ENCOUNTER — Other Ambulatory Visit: Payer: Self-pay

## 2015-04-18 ENCOUNTER — Encounter: Payer: Self-pay | Admitting: *Deleted

## 2015-04-18 ENCOUNTER — Encounter: Payer: Self-pay | Admitting: Physician Assistant

## 2015-04-18 ENCOUNTER — Other Ambulatory Visit: Payer: Self-pay | Admitting: *Deleted

## 2015-04-18 ENCOUNTER — Emergency Department
Admission: EM | Admit: 2015-04-18 | Discharge: 2015-04-18 | Disposition: A | Discharge status: Home / Self Care | Payer: Federal, State, Local not specified - PPO | Source: Home / Self Care | Attending: Family Medicine | Admitting: Family Medicine

## 2015-04-18 DIAGNOSIS — L298 Other pruritus: Secondary | ICD-10-CM | POA: Diagnosis not present

## 2015-04-18 MED ORDER — HYDROXYZINE HCL 25 MG PO TABS: 25.0000 mg | ORAL_TABLET | Freq: Four times a day (QID) | ORAL | Status: DC | PRN

## 2015-04-18 MED ORDER — PREDNISONE 20 MG PO TABS: ORAL_TABLET | ORAL | Status: DC

## 2015-04-18 NOTE — ED Notes (Signed)
Pt c/o itching and burning rash on her torso, arms, legs, and face x 5-6 days.

## 2015-04-18 NOTE — ED Provider Notes (Signed)
CSN: [id number]     Arrival date & time 04/18/15  1715 History   First MD Initiated Contact with Patient 04/18/15 1758     Chief Complaint  Patient presents with  . Rash   (Consider location/radiation/quality/duration/timing/severity/associated sxs/prior Treatment) HPI Pt is a 35yo female presenting to [facility] with c/o erythematous pruritic burning rash that started about 1 week ago.  Symptoms wax and wane throughout the day, seem to worsen after showering.  Rash is mostly on abdomen but has spread to back, arms, legs and now a few spots on her face.  She denies known allergen exposure. She has used topical benadryl but no relief.  She also notes she has been sick with a cough for almost 2 months but states the last antibiotic she had was about 3 weeks ago and cough is finally nearly gone. Denies fever, chills, n/v/d. Denies difficulty breathing or swallowing.  Past Medical History  Diagnosis Date  . Hypertension   . Complication of anesthesia     epidural caused syncope  . Preeclampsia in postpartum period    Past Surgical History  Procedure Laterality Date  . Cesarean section     Family History  Problem Relation Age of Onset  . Hyperlipidemia Mother   . Depression Mother   . Anxiety disorder Mother   . Diabetes Father   . Hyperlipidemia Father   . Hypertension Father   . Alcohol abuse Paternal Uncle   . Heart attack Paternal Grandfather    Social History  Substance Use Topics  . Smoking status: Former Smoker -- 0.50 packs/day    Types: Cigarettes    Quit date: 08/30/2013  . Smokeless tobacco: None  . Alcohol Use: 0.6 oz/week    1 Glasses of wine, 0 Standard drinks or equivalent per week     Comment: Every couple of weeks   OB History    Gravida Para Term Preterm AB TAB SAB Ectopic Multiple Living   4 2 2  1  1   2      Review of Systems  Constitutional: Positive for chills. Negative for fever.  HENT: Negative for congestion, ear pain, sore throat, trouble swallowing and  voice change.   Respiratory: Positive for cough. Negative for shortness of breath.   Cardiovascular: Negative for chest pain and palpitations.  Gastrointestinal: Negative for nausea, vomiting, abdominal pain and diarrhea.  Musculoskeletal: Negative for myalgias, back pain and arthralgias.  Skin: Positive for color change and rash. Negative for wound.    Allergies  Acetaminophen; Codeine; Nifedipine; and Penicillins  Home Medications   Prior to Admission medications   Medication Sig Start Date End Date Taking? Authorizing Provider  ALPRAZolam Prudy Feeler) 0.5 MG tablet Take 1 tablet (0.5 mg total) by mouth 2 (two) times daily as needed. 02/22/15  Yes Jade L Breeback, PA-C  amphetamine-dextroamphetamine (ADDERALL) 5 MG tablet Take 1 tablet (5 mg total) by mouth 3 (three) times daily. Do not fill before February 2017. 04/14/15 04/13/16 Yes Thresa Ross, MD  citalopram (CELEXA) 10 MG tablet Take 1 tablet (10 mg total) by mouth daily. 04/14/15  Yes Thresa Ross, MD  traZODone (DESYREL) 50 MG tablet Take 0.5-1 tablets (25-50 mg total) by mouth at bedtime as needed for sleep. 03/06/15  Yes Jade L Breeback, PA-C  hydrOXYzine (ATARAX/VISTARIL) 25 MG tablet Take 1 tablet (25 mg total) by mouth every 6 (six) hours as needed for itching. 04/18/15   Junius Finner, PA-C  predniSONE (DELTASONE) 20 MG tablet 3 tabs po day one, then  2 po daily x 4 days 04/18/15   Junius FinnerErin O'Malley, PA-C   Meds Ordered and Administered this Visit  Medications - No data to display  BP 128/86 mmHg  Pulse 91  Temp(Src) 97.8 F (36.6 C) (Oral)  Resp 16  Ht 5\' 7"  (1.702 m)  Wt 162 lb (73.483 kg)  BMI 25.37 kg/m2  SpO2 99%  LMP 04/08/2015 No data found.   Physical Exam  Constitutional: She appears well-developed and well-nourished. No distress.  HENT:  Head: Normocephalic and atraumatic.  Eyes: Conjunctivae are normal. No scleral icterus.  Neck: Normal range of motion.  Cardiovascular: Normal rate, regular rhythm and normal  heart sounds.   Pulmonary/Chest: Effort normal and breath sounds normal. No respiratory distress. She has no wheezes. She has no rales. She exhibits no tenderness.  Abdominal: Soft. She exhibits no distension and no mass. There is no tenderness. There is no rebound and no guarding.  Musculoskeletal: Normal range of motion.  Neurological: She is alert.  Skin: Skin is warm and dry. Rash noted. She is not diaphoretic. There is erythema.  Fine erythematous papular rash, worse on abdomen. Fine erythematous patches on back.  Non-tender. No bleeding or discharge.  Rash does blanch.  Nursing note and vitals reviewed.   ED Course  Procedures (including critical care time)  Labs Review Labs Reviewed - No data to display  Imaging Review No results found.    MDM   1. Pruritic erythematous rash    Pt c/o diffuse erythematous pruritic burning rash. Pt has had a cough for about 2 months but last antibiotic was over 3 weeks ago.  No evidence of underlying infection.  Will attempt to treat rash symptomatically.  Rx: prednisone and atarax   F/u with PCP for recheck of symptoms if not improving. Patient verbalized understanding and agreement with treatment plan.     Junius Finnerrin O'Malley, PA-C 04/19/15 1120

## 2015-04-19 ENCOUNTER — Ambulatory Visit (INDEPENDENT_AMBULATORY_CARE_PROVIDER_SITE_OTHER): Payer: Federal, State, Local not specified - PPO | Admitting: Physician Assistant

## 2015-04-19 ENCOUNTER — Encounter: Payer: Self-pay | Admitting: Physician Assistant

## 2015-04-19 ENCOUNTER — Telehealth (HOSPITAL_COMMUNITY): Payer: Self-pay | Admitting: *Deleted

## 2015-04-19 VITALS — BP 140/89 | HR 98 | Ht 67.0 in | Wt 164.0 lb

## 2015-04-19 DIAGNOSIS — F4310 Post-traumatic stress disorder, unspecified: Secondary | ICD-10-CM | POA: Diagnosis not present

## 2015-04-19 DIAGNOSIS — F430 Acute stress reaction: Secondary | ICD-10-CM | POA: Diagnosis not present

## 2015-04-19 DIAGNOSIS — L298 Other pruritus: Secondary | ICD-10-CM | POA: Diagnosis not present

## 2015-04-19 DIAGNOSIS — F410 Panic disorder [episodic paroxysmal anxiety] without agoraphobia: Secondary | ICD-10-CM | POA: Diagnosis not present

## 2015-04-19 MED ORDER — ALPRAZOLAM 0.5 MG PO TABS: 0.5000 mg | ORAL_TABLET | Freq: Two times a day (BID) | ORAL | Status: DC | PRN

## 2015-04-19 NOTE — Telephone Encounter (Signed)
Pt called for a refill on Adderall 5mg . Pt states she will need a prior authorization for Adderall 5mg . [facility] and [facility]. Per Rene Kocheregina, pt will need a new prescription written for Adderall 5, #60 once prior authorization is obtain from insurance company.

## 2015-04-19 NOTE — Progress Notes (Signed)
   Subjective:    Patient ID: Morgan Miller, female    DOB: 08/29/1980, 35 y.o.   MRN: [id number]  HPI Patient is a 35 year old female who presents to the clinic with a pruritic rash for the last week. She was seen in urgent care yesterday. She was given prednisone and hydroxyzine. She does feel like the rash is already improving. The hydroxyzine is helping significantly with itching. She denies any fever, chills, sinus pressure. No one else in the house has rash. She has never had anything like this before. She denies any new detergents or lotions. She does not have a history of eczema.  Patient would like a refill on her Xanax. She continues to have panic attacks almost daily. She usually tries to break the Xanax in half and only take a half a tab. When she gets panic attacks she definitely get short of breath and clammy. She is going downstairs to behavioral health. She also has counseling once a week. She does feel like her overall anxiety is improving.   Review of Systems  All other systems reviewed and are negative.      Objective:   Physical Exam  Constitutional: She is oriented to person, place, and time. She appears well-developed and well-nourished.  HENT:  Head: Normocephalic and atraumatic.  Cardiovascular: Normal rate, regular rhythm and normal heart sounds.   Pulmonary/Chest: Effort normal and breath sounds normal.  Neurological: She is alert and oriented to person, place, and time.  Skin:  Fine papular erythematous rash under left breast and left flank. Not blanchable.   Psychiatric: She has a normal mood and affect. Her behavior is normal.          Assessment & Plan:  Pruritic erythematous rash-patient was seen in urgent care yesterday. Rash does seem to be improving. Continue with treatment plan of prednisone and hydroxyzine. Follow-up if rash not improving or if comes back.   Panic disorder/emotional stress/PTSD- refilled Xanax or as needed usage. Do not use at  bedtime with trazodone. Discussed with patient only used when needed for panic attack. Continue with counseling weekly. Continue with behavioral health downstairs for management of PTSD and medications. I certainly think there is room for increase of Celexa so I advised her to follow-up with Dr. Gilmore LarocheAkhtar.

## 2015-04-20 ENCOUNTER — Telehealth (HOSPITAL_COMMUNITY): Payer: Self-pay | Admitting: *Deleted

## 2015-04-20 MED ORDER — AMPHETAMINE-DEXTROAMPHETAMINE 15 MG PO TABS: 15.0000 mg | ORAL_TABLET | Freq: Every day | ORAL | Status: DC

## 2015-04-20 MED ORDER — AMPHETAMINE-DEXTROAMPHET ER 15 MG PO CP24: 15.0000 mg | ORAL_CAPSULE | ORAL | Status: DC

## 2015-04-20 NOTE — Telephone Encounter (Signed)
Re wrote for tablet 15mg  instead of capsule adderall.

## 2015-04-20 NOTE — Telephone Encounter (Signed)
Pt will need a new prescription for Adderall 15mg . Pt states she will need a new prescription written for 15mg  tablets not capsules. Informed pt she will need to bring previous prescription back before another prescription could be issued. Pt would like to pick up prescription today. Please call once prescription is ready for pick up.

## 2015-04-20 NOTE — Telephone Encounter (Signed)
Pt called for the second time concerning the medication for Adderall 5mg . Pt will need a prescription written for Adderall 15mg , # 20 for insurance to approve the refill. Pt will pick up refill after lunch. Please call once prescription is ready at [phone].

## 2015-04-20 NOTE — Telephone Encounter (Signed)
Didn't get approve for tid at 5mg . Will change dose to 15mg  bid or take one in the morning.

## 2015-04-25 ENCOUNTER — Encounter: Payer: Self-pay | Admitting: Physician Assistant

## 2015-04-25 ENCOUNTER — Telehealth (HOSPITAL_COMMUNITY): Payer: Self-pay | Admitting: *Deleted

## 2015-04-25 NOTE — Telephone Encounter (Signed)
Prior authorization for Adderall received. Called [phone] spoke with Cherrelle who gave approval from 02/24/15-02/22/17. Called to notify pharmacy.

## 2015-04-26 ENCOUNTER — Ambulatory Visit: Payer: Self-pay | Admitting: Physician Assistant

## 2015-04-27 ENCOUNTER — Other Ambulatory Visit: Payer: Self-pay | Admitting: Family Medicine

## 2015-04-27 ENCOUNTER — Encounter: Payer: Self-pay | Admitting: Family Medicine

## 2015-04-27 ENCOUNTER — Ambulatory Visit (INDEPENDENT_AMBULATORY_CARE_PROVIDER_SITE_OTHER): Payer: Federal, State, Local not specified - PPO | Admitting: Family Medicine

## 2015-04-27 VITALS — BP 126/74 | HR 90 | Wt 166.0 lb

## 2015-04-27 DIAGNOSIS — M542 Cervicalgia: Secondary | ICD-10-CM | POA: Diagnosis not present

## 2015-04-27 DIAGNOSIS — L298 Other pruritus: Secondary | ICD-10-CM

## 2015-04-27 DIAGNOSIS — N760 Acute vaginitis: Secondary | ICD-10-CM

## 2015-04-27 DIAGNOSIS — N898 Other specified noninflammatory disorders of vagina: Secondary | ICD-10-CM

## 2015-04-27 MED ORDER — METRONIDAZOLE 500 MG PO TABS
500.0000 mg | ORAL_TABLET | Freq: Two times a day (BID) | ORAL | Status: DC
Start: 2015-04-27 — End: 2015-07-14

## 2015-04-27 MED ORDER — CYCLOBENZAPRINE HCL 10 MG PO TABS: 10.0000 mg | ORAL_TABLET | Freq: Three times a day (TID) | ORAL | Status: DC | PRN

## 2015-04-27 MED ORDER — FLUCONAZOLE 150 MG PO TABS: 150.0000 mg | ORAL_TABLET | Freq: Once | ORAL | Status: DC

## 2015-04-27 NOTE — Progress Notes (Signed)
Morgan Miller is a 35 y.o. female who presents to [facility]: Primary Care today for right lateral neck pain and vaginal irritation and discharge.  1) vaginal discharge: Present for about one week. This is associated with vulvar irritation. She denies any abdominal pain and dysuria fevers chills nausea vomiting or diarrhea. She thinks her symptoms are consistent with previous episodes of vaginal yeast infection. She has not tried any treatment yet.  2) right lateral neck pain: Present for about one week as well. No injury. No radiating pain weakness or numbness. She's tried some ibuprofen which helps. Symptoms are moderate.   Past Medical History  Diagnosis Date  . Hypertension   . Complication of anesthesia     epidural caused syncope  . Preeclampsia in postpartum period    Past Surgical History  Procedure Laterality Date  . Cesarean section     Social History  Substance Use Topics  . Smoking status: Former Smoker -- 0.50 packs/day    Types: Cigarettes    Quit date: 08/30/2013  . Smokeless tobacco: Not on file  . Alcohol Use: 0.6 oz/week    1 Glasses of wine, 0 Standard drinks or equivalent per week     Comment: Every couple of weeks   family history includes Alcohol abuse in her paternal uncle; Anxiety disorder in her mother; Depression in her mother; Diabetes in her father; Heart attack in her paternal grandfather; Hyperlipidemia in her father and mother; Hypertension in her father.  ROS as above Medications: Current Outpatient Prescriptions  Medication Sig Dispense Refill  . ALPRAZolam (XANAX) 0.5 MG tablet Take 1 tablet (0.5 mg total) by mouth 2 (two) times daily as needed. 60 tablet 2  . amphetamine-dextroamphetamine (ADDERALL) 15 MG tablet Take 1 tablet by mouth daily. Can take half twice a day. 30 tablet 0  . citalopram (CELEXA) 10 MG tablet Take 1 tablet (10 mg total) by mouth  daily. 30 tablet 0  . cyclobenzaprine (FLEXERIL) 10 MG tablet Take 1 tablet (10 mg total) by mouth 3 (three) times daily as needed for muscle spasms. 30 tablet 0  . fluconazole (DIFLUCAN) 150 MG tablet Take 1 tablet (150 mg total) by mouth once. 1 tablet 0  . metroNIDAZOLE (FLAGYL) 500 MG tablet Take 1 tablet (500 mg total) by mouth 2 (two) times daily. 14 tablet 0  . traZODone (DESYREL) 50 MG tablet Take 0.5-1 tablets (25-50 mg total) by mouth at bedtime as needed for sleep. 30 tablet 1  . [DISCONTINUED] FLUoxetine (PROZAC) 20 MG tablet Take 1 tablet (20 mg total) by mouth daily. (Patient not taking: Reported on 03/17/2015) 30 tablet 0   No current facility-administered medications for this visit.   Allergies  Allergen Reactions  . Codeine Nausea Only  . Nifedipine     fatigue  . Penicillins      Exam:  BP 126/74 mmHg  Pulse 90  Wt 166 lb (75.297 kg)  LMP 04/08/2015 Gen: Well NAD HEENT: EOMI,  MMM Lungs: Normal work of breathing. CTABL Heart: RRR no MRG Abd: NABS, Soft. Nondistended, Nontender Exts: Brisk capillary refill, warm and well perfused.  GYN: Normal external genitalia. Vaginal canal with thin white discharge. Cervical os is normal-appearing with no discharge. Nontender. Neck: Nontender to the posterior spinal midline. Tender palpation right cervical paraspinals and trapezius. Normal neck range of motion but pain with right rotation and left lateral flexion. Upper she be strength is intact and equal throughout. Reflexes are intact and  equal throughout. Sensation is intact and equal throughout.  No results found for this or any previous visit (from the past 24 hour(s)). No results found.   Please see individual assessment and plan sections.

## 2015-04-27 NOTE — Assessment & Plan Note (Signed)
BV versus yeast. Wet prep GC chlamydia pending. Empiric treatment with fluconazole and metronidazole. Return as needed.

## 2015-04-27 NOTE — Assessment & Plan Note (Signed)
Cervical myofascial strain. Plan to treat with physical therapy NSAIDs Flexeril. Return if not better.

## 2015-04-27 NOTE — Patient Instructions (Signed)
Thank you for coming in today. Vaginitis: Take fluconazole and flagyl.  We will call with results.  Return if not better.   Neck pain:  Take flexeril for pain at night as needed,  Attend PT.  Return in a few weeks if not better.   Bacterial Vaginosis Bacterial vaginosis is a vaginal infection that occurs when the normal balance of bacteria in the vagina is disrupted. It results from an overgrowth of certain bacteria. This is the most common vaginal infection in women of childbearing age. Treatment is important to prevent complications, especially in pregnant women, as it can cause a premature delivery. CAUSES  Bacterial vaginosis is caused by an increase in harmful bacteria that are normally present in smaller amounts in the vagina. Several different kinds of bacteria can cause bacterial vaginosis. However, the reason that the condition develops is not fully understood. RISK FACTORS Certain activities or behaviors can put you at an increased risk of developing bacterial vaginosis, including:  Having a new sex partner or multiple sex partners.  Douching.  Using an intrauterine device (IUD) for contraception. Women do not get bacterial vaginosis from toilet seats, bedding, swimming pools, or contact with objects around them. SIGNS AND SYMPTOMS  Some women with bacterial vaginosis have no signs or symptoms. Common symptoms include:  Grey vaginal discharge.  A fishlike odor with discharge, especially after sexual intercourse.  Itching or burning of the vagina and vulva.  Burning or pain with urination. DIAGNOSIS  Your health care provider will take a medical history and examine the vagina for signs of bacterial vaginosis. A sample of vaginal fluid may be taken. Your health care provider will look at this sample under a microscope to check for bacteria and abnormal cells. A vaginal pH test may also be done.  TREATMENT  Bacterial vaginosis may be treated with antibiotic medicines.  These may be given in the form of a pill or a vaginal cream. A second round of antibiotics may be prescribed if the condition comes back after treatment. Because bacterial vaginosis increases your risk for sexually transmitted diseases, getting treated can help reduce your risk for chlamydia, gonorrhea, HIV, and herpes. HOME CARE INSTRUCTIONS   Only take over-the-counter or prescription medicines as directed by your health care provider.  If antibiotic medicine was prescribed, take it as directed. Make sure you finish it even if you start to feel better.  Tell all sexual partners that you have a vaginal infection. They should see their health care provider and be treated if they have problems, such as a mild rash or itching.  During treatment, it is important that you follow these instructions:  Avoid sexual activity or use condoms correctly.  Do not douche.  Avoid alcohol as directed by your health care provider.  Avoid breastfeeding as directed by your health care provider. SEEK MEDICAL CARE IF:   Your symptoms are not improving after 3 days of treatment.  You have increased discharge or pain.  You have a fever. MAKE SURE YOU:   Understand these instructions.  Will watch your condition.  Will get help right away if you are not doing well or get worse. FOR MORE INFORMATION  Centers for Disease Control and Prevention, Division of STD Prevention: SolutionApps.co.za American Sexual Health Association (ASHA): www.ashastd.org    This information is not intended to replace advice given to you by your health care provider. Make sure you discuss any questions you have with your health care provider.   Document Released: 03/25/2005 Document  Revised: 04/15/2014 Document Reviewed: 11/04/2012 Elsevier Interactive Patient Education 2016 [employer].  Cervical Sprain A cervical sprain is an injury in the neck in which the strong, fibrous tissues (ligaments) that connect your neck bones  stretch or tear. Cervical sprains can range from mild to severe. Severe cervical sprains can cause the neck vertebrae to be unstable. This can lead to damage of the spinal cord and can result in serious nervous system problems. The amount of time it takes for a cervical sprain to get better depends on the cause and extent of the injury. Most cervical sprains heal in 1 to 3 weeks. CAUSES  Severe cervical sprains may be caused by:   Contact sport injuries (such as from football, rugby, wrestling, hockey, auto racing, gymnastics, diving, martial arts, or boxing).   Motor vehicle collisions.   Whiplash injuries. This is an injury from a sudden forward and backward whipping movement of the head and neck.  Falls.  Mild cervical sprains may be caused by:   Being in an awkward position, such as while cradling a telephone between your ear and shoulder.   Sitting in a chair that does not offer proper support.   Working at a poorly Marketing executive station.   Looking up or down for long periods of time.  SYMPTOMS   Pain, soreness, stiffness, or a burning sensation in the front, back, or sides of the neck. This discomfort may develop immediately after the injury or slowly, 24 hours or more after the injury.   Pain or tenderness directly in the middle of the back of the neck.   Shoulder or upper back pain.   Limited ability to move the neck.   Headache.   Dizziness.   Weakness, numbness, or tingling in the hands or arms.   Muscle spasms.   Difficulty swallowing or chewing.   Tenderness and swelling of the neck.  DIAGNOSIS  Most of the time your health care provider can diagnose a cervical sprain by taking your history and doing a physical exam. Your health care provider will ask about previous neck injuries and any known neck problems, such as arthritis in the neck. X-rays may be taken to find out if there are any other problems, such as with the bones of the neck.  Other tests, such as a CT scan or MRI, may also be needed.  TREATMENT  Treatment depends on the severity of the cervical sprain. Mild sprains can be treated with rest, keeping the neck in place (immobilization), and pain medicines. Severe cervical sprains are immediately immobilized. Further treatment is done to help with pain, muscle spasms, and other symptoms and may include:  Medicines, such as pain relievers, numbing medicines, or muscle relaxants.   Physical therapy. This may involve stretching exercises, strengthening exercises, and posture training. Exercises and improved posture can help stabilize the neck, strengthen muscles, and help stop symptoms from returning.  HOME CARE INSTRUCTIONS   Put ice on the injured area.   Put ice in a plastic bag.   Place a towel between your skin and the bag.   Leave the ice on for 15-20 minutes, 3-4 times a day.   If your injury was severe, you may have been given a cervical collar to wear. A cervical collar is a two-piece collar designed to keep your neck from moving while it heals.  Do not remove the collar unless instructed by your health care provider.  If you have long hair, keep it outside of the collar.  Ask your health care provider before making any adjustments to your collar. Minor adjustments may be required over time to improve comfort and reduce pressure on your chin or on the back of your head.  Ifyou are allowed to remove the collar for cleaning or bathing, follow your health care provider's instructions on how to do so safely.  Keep your collar clean by wiping it with mild soap and water and drying it completely. If the collar you have been given includes removable pads, remove them every 1-2 days and hand wash them with soap and water. Allow them to air dry. They should be completely dry before you wear them in the collar.  If you are allowed to remove the collar for cleaning and bathing, wash and dry the skin of your  neck. Check your skin for irritation or sores. If you see any, tell your health care provider.  Do not drive while wearing the collar.   Only take over-the-counter or prescription medicines for pain, discomfort, or fever as directed by your health care provider.   Keep all follow-up appointments as directed by your health care provider.   Keep all physical therapy appointments as directed by your health care provider.   Make any needed adjustments to your workstation to promote good posture.   Avoid positions and activities that make your symptoms worse.   Warm up and stretch before being active to help prevent problems.  SEEK MEDICAL CARE IF:   Your pain is not controlled with medicine.   You are unable to decrease your pain medicine over time as planned.   Your activity level is not improving as expected.  SEEK IMMEDIATE MEDICAL CARE IF:   You develop any bleeding.  You develop stomach upset.  You have signs of an allergic reaction to your medicine.   Your symptoms get worse.   You develop new, unexplained symptoms.   You have numbness, tingling, weakness, or paralysis in any part of your body.  MAKE SURE YOU:   Understand these instructions.  Will watch your condition.  Will get help right away if you are not doing well or get worse.   This information is not intended to replace advice given to you by your health care provider. Make sure you discuss any questions you have with your health care provider.   Document Released: 01/20/2007 Document Revised: 03/30/2013 Document Reviewed: 09/30/2012 Elsevier Interactive Patient Education [employer]

## 2015-04-28 LAB — WET PREP FOR TRICH, YEAST, CLUE
TRICH WET PREP: NONE SEEN
WBC, Wet Prep HPF POC: NONE SEEN

## 2015-04-28 LAB — GC/CHLAMYDIA PROBE AMP
CT Probe RNA: NOT DETECTED
GC Probe RNA: NOT DETECTED

## 2015-04-28 NOTE — Progress Notes (Signed)
Quick Note:  Labs show BV and yeast. Awaiting gonorrhea and Chlamydia tests. ______

## 2015-04-28 NOTE — Progress Notes (Signed)
Quick Note:  Chlamydia and gonorrhea are negative. ______

## 2015-05-19 ENCOUNTER — Telehealth (HOSPITAL_COMMUNITY): Payer: Self-pay | Admitting: *Deleted

## 2015-05-19 MED ORDER — AMPHETAMINE-DEXTROAMPHETAMINE 15 MG PO TABS: 15.0000 mg | ORAL_TABLET | Freq: Every day | ORAL | Status: DC

## 2015-05-19 NOTE — Telephone Encounter (Signed)
adderall printed for pick up 

## 2015-05-19 NOTE — Telephone Encounter (Signed)
Pt will need a prescription written for amphetamine-dextroamphetamine (ADDER ALL) 15 MG tablet.  Pt will be pick up prescription after lunch today.

## 2015-06-13 ENCOUNTER — Ambulatory Visit (HOSPITAL_COMMUNITY): Payer: Self-pay | Admitting: Psychiatry

## 2015-06-20 ENCOUNTER — Ambulatory Visit (INDEPENDENT_AMBULATORY_CARE_PROVIDER_SITE_OTHER): Payer: Federal, State, Local not specified - PPO | Admitting: Psychiatry

## 2015-06-20 ENCOUNTER — Encounter (HOSPITAL_COMMUNITY): Payer: Self-pay | Admitting: Psychiatry

## 2015-06-20 VITALS — BP 135/75 | HR 80 | Wt 164.0 lb

## 2015-06-20 DIAGNOSIS — F900 Attention-deficit hyperactivity disorder, predominantly inattentive type: Secondary | ICD-10-CM | POA: Diagnosis not present

## 2015-06-20 DIAGNOSIS — F4310 Post-traumatic stress disorder, unspecified: Secondary | ICD-10-CM | POA: Diagnosis not present

## 2015-06-20 DIAGNOSIS — F410 Panic disorder [episodic paroxysmal anxiety] without agoraphobia: Secondary | ICD-10-CM | POA: Diagnosis not present

## 2015-06-20 MED ORDER — CITALOPRAM HYDROBROMIDE 10 MG PO TABS: 10.0000 mg | ORAL_TABLET | Freq: Every day | ORAL | Status: DC

## 2015-06-20 MED ORDER — AMPHETAMINE-DEXTROAMPHETAMINE 15 MG PO TABS: 15.0000 mg | ORAL_TABLET | Freq: Every day | ORAL | Status: DC
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
## 2015-06-26 ENCOUNTER — Encounter: Payer: Self-pay | Admitting: Physician Assistant

## 2015-07-10 ENCOUNTER — Telehealth (HOSPITAL_COMMUNITY): Payer: Self-pay | Admitting: Psychiatry

## 2015-07-10 ENCOUNTER — Encounter: Payer: Self-pay | Admitting: Physician Assistant

## 2015-07-10 NOTE — Telephone Encounter (Signed)
Return telephone call to pt. Pt states she will need FMLA paperwork completed for her job. Pt will drop off paperwork tomorrow. Pt is schedule for a f/u appt on 07/12/15.

## 2015-07-11 NOTE — Telephone Encounter (Signed)
Pt had to rschd due to work training. Pt will be out of medication before her next appt on 4/18.

## 2015-07-12 ENCOUNTER — Ambulatory Visit (HOSPITAL_COMMUNITY): Payer: Self-pay | Admitting: Psychiatry

## 2015-07-12 NOTE — Telephone Encounter (Signed)
LVM for pt to return call to office. Per Dr. Gilmore LarocheAkhtar, refill request for Adderall and Celexa has been denied. Pt has request medication too soon. Next refill is due on 07/21/15. Pt is schedule for a f/u appt on 07/25/15.

## 2015-07-13 MED ORDER — AMPHETAMINE-DEXTROAMPHETAMINE 15 MG PO TABS: 15.0000 mg | ORAL_TABLET | Freq: Every day | ORAL | Status: DC

## 2015-07-13 MED ORDER — CITALOPRAM HYDROBROMIDE 10 MG PO TABS: 10.0000 mg | ORAL_TABLET | Freq: Every day | ORAL | Status: DC

## 2015-07-13 NOTE — Telephone Encounter (Signed)
adderall and celexa printed for pickup

## 2015-07-14 ENCOUNTER — Encounter: Payer: Self-pay | Admitting: Physician Assistant

## 2015-07-14 ENCOUNTER — Ambulatory Visit (INDEPENDENT_AMBULATORY_CARE_PROVIDER_SITE_OTHER): Payer: Federal, State, Local not specified - PPO | Admitting: Physician Assistant

## 2015-07-14 VITALS — BP 135/83 | HR 100 | Temp 98.0°F | Wt 161.0 lb

## 2015-07-14 DIAGNOSIS — R5383 Other fatigue: Secondary | ICD-10-CM | POA: Diagnosis not present

## 2015-07-14 DIAGNOSIS — L659 Nonscarring hair loss, unspecified: Secondary | ICD-10-CM | POA: Diagnosis not present

## 2015-07-14 DIAGNOSIS — G4700 Insomnia, unspecified: Secondary | ICD-10-CM | POA: Diagnosis not present

## 2015-07-14 MED ORDER — ALPRAZOLAM 0.5 MG PO TABS: 0.5000 mg | ORAL_TABLET | Freq: Two times a day (BID) | ORAL | Status: DC | PRN

## 2015-07-14 MED ORDER — DIPHENHYDRAMINE HCL (SLEEP) 50 MG PO CAPS: ORAL_CAPSULE | ORAL | Status: DC

## 2015-07-14 MED ORDER — SUVOREXANT 15 MG PO TABS: 1.0000 | ORAL_TABLET | Freq: Every day | ORAL | Status: DC

## 2015-07-14 NOTE — Progress Notes (Signed)
   Subjective:    Patient ID: Morgan Miller, female    DOB: 03/07/1981, 35 y.o.   MRN: [id number]  HPI  Pt is a 35 yo female who presents to the clinic to discuss diffuse hair loss, no energy, and insomnia. She is undergoing an emotional crises and being seen by psych for PtSD, panic disorder, depression. She is on celexa.  I do give her as needed xanax. She is using about one a day.  She is under a lot of stress. She is in a Arboriculturistlaw suit at work, her marriage is falling apart, she just feels overwhelmed and alone. She is having a lot of problems sleeping. Trazodone does not work and makes her droswy in am. Now her hair is falling out. She wants to makes sure blood work looks good. No patches of hair loss just diffuse.     Review of Systems See HPI.     Objective:   Physical Exam  Constitutional: She is oriented to person, place, and time. She appears well-developed and well-nourished.  HENT:  Head: Normocephalic and atraumatic.  No patches of hair loss. Seems to be some thinning over top of patients head and hair line.   Eyes: Conjunctivae are normal. Right eye exhibits no discharge. Left eye exhibits no discharge.  Neck: Normal range of motion. Neck supple. No thyromegaly present.  Cardiovascular: Normal rate, regular rhythm and normal heart sounds.   Pulmonary/Chest: Effort normal and breath sounds normal.  Lymphadenopathy:    She has no cervical adenopathy.  Neurological: She is alert and oriented to person, place, and time.  Skin: Skin is dry.  Psychiatric: She has a normal mood and affect. Her behavior is normal.          Assessment & Plan:  Hair loss/no energy- fatigue panel ordered. I feel like her tremendous amt of stress could be causing fatigue/hair loss and all of her symptoms. Encouraged pt to start biotin vitamins.   Panic disorder- continue to see psych. Xanax refilled with abuse potential discussed and to only take when needed. I STRONGLY encourage counseling. Pt feels  like she does not have time for it right now.   Insomnia- try belsomnra. Discussed need to give 2 week at least. Shouldn't cause her to feel drowsy.

## 2015-07-14 NOTE — Patient Instructions (Signed)
unisom for sleep 

## 2015-07-15 LAB — HEMOGLOBIN A1C
Hgb A1c MFr Bld: 5.7 % — ABNORMAL HIGH (ref <5.7)
MEAN PLASMA GLUCOSE: 117 mg/dL

## 2015-07-15 LAB — CBC
HCT: 42 % (ref 35.0–45.0)
Hemoglobin: 13.7 g/dL (ref 11.7–15.5)
MCH: 30.4 pg (ref 27.0–33.0)
MCHC: 32.6 g/dL (ref 32.0–36.0)
MCV: 93.1 fL (ref 80.0–100.0)
MPV: 9.8 fL (ref 7.5–12.5)
Platelets: 380 10*3/uL (ref 140–400)
RBC: 4.51 MIL/uL (ref 3.80–5.10)
RDW: 13.3 % (ref 11.0–15.0)
WBC: 14.6 10*3/uL — AB (ref 3.8–10.8)

## 2015-07-15 LAB — COMPREHENSIVE METABOLIC PANEL
ALK PHOS: 49 U/L (ref 33–115)
ALT: 13 U/L (ref 6–29)
AST: 15 U/L (ref 10–30)
Albumin: 4.5 g/dL (ref 3.6–5.1)
BILIRUBIN TOTAL: 0.4 mg/dL (ref 0.2–1.2)
BUN: 15 mg/dL (ref 7–25)
CO2: 19 mmol/L — AB (ref 20–31)
CREATININE: 0.56 mg/dL (ref 0.50–1.10)
Calcium: 9.9 mg/dL (ref 8.6–10.2)
Chloride: 103 mmol/L (ref 98–110)
GLUCOSE: 74 mg/dL (ref 65–99)
Potassium: 4 mmol/L (ref 3.5–5.3)
SODIUM: 138 mmol/L (ref 135–146)
Total Protein: 7.6 g/dL (ref 6.1–8.1)

## 2015-07-15 LAB — FERRITIN: Ferritin: 127 ng/mL (ref 10–154)

## 2015-07-15 LAB — C-REACTIVE PROTEIN: CRP: 0.5 mg/dL (ref <0.60)

## 2015-07-15 LAB — VITAMIN D 25 HYDROXY (VIT D DEFICIENCY, FRACTURES): Vit D, 25-Hydroxy: 36 ng/mL (ref 30–100)

## 2015-07-15 LAB — SEDIMENTATION RATE: Sed Rate: 4 mm/hr (ref 0–20)

## 2015-07-15 LAB — TSH: TSH: 1.18 mIU/L

## 2015-07-15 LAB — VITAMIN B12: Vitamin B-12: 367 pg/mL (ref 200–1100)

## 2015-07-18 ENCOUNTER — Encounter: Payer: Self-pay | Admitting: Physician Assistant

## 2015-07-18 ENCOUNTER — Telehealth: Payer: Self-pay | Admitting: *Deleted

## 2015-07-18 DIAGNOSIS — D72829 Elevated white blood cell count, unspecified: Secondary | ICD-10-CM

## 2015-07-24 ENCOUNTER — Encounter: Payer: Self-pay | Admitting: Physician Assistant

## 2015-07-26 ENCOUNTER — Ambulatory Visit (HOSPITAL_COMMUNITY): Payer: Self-pay | Admitting: Psychiatry

## 2015-07-27 ENCOUNTER — Encounter: Payer: Self-pay | Admitting: Physician Assistant

## 2015-07-28 ENCOUNTER — Other Ambulatory Visit: Payer: Self-pay | Admitting: Physician Assistant

## 2015-07-28 MED ORDER — CLOBETASOL PROPIONATE 0.05 % EX CREA: 1.0000 "application " | TOPICAL_CREAM | Freq: Two times a day (BID) | CUTANEOUS | Status: DC

## 2015-08-10 ENCOUNTER — Encounter (HOSPITAL_COMMUNITY): Payer: Self-pay | Admitting: Psychiatry

## 2015-08-10 ENCOUNTER — Ambulatory Visit (INDEPENDENT_AMBULATORY_CARE_PROVIDER_SITE_OTHER): Payer: Federal, State, Local not specified - PPO | Admitting: Psychiatry

## 2015-08-10 VITALS — BP 126/70 | HR 103 | Ht 67.0 in | Wt 161.0 lb

## 2015-08-10 DIAGNOSIS — F900 Attention-deficit hyperactivity disorder, predominantly inattentive type: Secondary | ICD-10-CM | POA: Diagnosis not present

## 2015-08-10 DIAGNOSIS — F4310 Post-traumatic stress disorder, unspecified: Secondary | ICD-10-CM | POA: Diagnosis not present

## 2015-08-10 DIAGNOSIS — F410 Panic disorder [episodic paroxysmal anxiety] without agoraphobia: Secondary | ICD-10-CM | POA: Diagnosis not present

## 2015-08-10 DIAGNOSIS — F331 Major depressive disorder, recurrent, moderate: Secondary | ICD-10-CM | POA: Diagnosis not present

## 2015-08-10 MED ORDER — CITALOPRAM HYDROBROMIDE 10 MG PO TABS: 15.0000 mg | ORAL_TABLET | Freq: Every day | ORAL | Status: DC

## 2015-08-10 MED ORDER — CITALOPRAM HYDROBROMIDE 10 MG PO TABS: 10.0000 mg | ORAL_TABLET | Freq: Every day | ORAL | Status: DC

## 2015-08-10 MED ORDER — AMPHETAMINE-DEXTROAMPHETAMINE 15 MG PO TABS: 15.0000 mg | ORAL_TABLET | Freq: Every day | ORAL | Status: DC
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
## 2015-08-18 ENCOUNTER — Other Ambulatory Visit: Payer: Self-pay | Admitting: *Deleted

## 2015-08-18 ENCOUNTER — Encounter: Payer: Self-pay | Admitting: Physician Assistant

## 2015-08-18 DIAGNOSIS — N760 Acute vaginitis: Secondary | ICD-10-CM

## 2015-08-18 MED ORDER — FLUCONAZOLE 150 MG PO TABS: 150.0000 mg | ORAL_TABLET | Freq: Once | ORAL | Status: DC

## 2015-09-05 ENCOUNTER — Encounter: Payer: Self-pay | Admitting: Physician Assistant

## 2015-09-05 ENCOUNTER — Other Ambulatory Visit: Payer: Self-pay | Admitting: Physician Assistant

## 2015-09-05 MED ORDER — AZITHROMYCIN 250 MG PO TABS: ORAL_TABLET | ORAL | Status: DC

## 2015-09-05 NOTE — Progress Notes (Signed)
Pt is out of town. Had sinus pressure, cough, congestion, URI symptoms for last 2 weeks. Sent zpak. Follow up if not improving.

## 2015-09-06 ENCOUNTER — Encounter: Payer: Self-pay | Admitting: Physician Assistant

## 2015-09-07 ENCOUNTER — Encounter (HOSPITAL_COMMUNITY): Payer: Self-pay | Admitting: Psychiatry

## 2015-09-07 ENCOUNTER — Ambulatory Visit (INDEPENDENT_AMBULATORY_CARE_PROVIDER_SITE_OTHER): Payer: Federal, State, Local not specified - PPO | Admitting: Psychiatry

## 2015-09-07 VITALS — BP 128/78 | HR 89 | Ht 67.0 in | Wt 159.8 lb

## 2015-09-07 DIAGNOSIS — F331 Major depressive disorder, recurrent, moderate: Secondary | ICD-10-CM

## 2015-09-07 DIAGNOSIS — F4310 Post-traumatic stress disorder, unspecified: Secondary | ICD-10-CM

## 2015-09-07 DIAGNOSIS — F900 Attention-deficit hyperactivity disorder, predominantly inattentive type: Secondary | ICD-10-CM | POA: Diagnosis not present

## 2015-09-07 DIAGNOSIS — F410 Panic disorder [episodic paroxysmal anxiety] without agoraphobia: Secondary | ICD-10-CM | POA: Diagnosis not present

## 2015-09-07 MED ORDER — CITALOPRAM HYDROBROMIDE 10 MG PO TABS: 15.0000 mg | ORAL_TABLET | Freq: Every day | ORAL | Status: DC

## 2015-09-07 MED ORDER — AMPHETAMINE-DEXTROAMPHETAMINE 15 MG PO TABS: 15.0000 mg | ORAL_TABLET | Freq: Every day | ORAL | Status: DC

## 2015-09-07 MED ORDER — AMPHETAMINE-DEXTROAMPHETAMINE 15 MG PO TABS
15.0000 mg | ORAL_TABLET | Freq: Every day | ORAL | Status: DC
Start: 2015-09-07 — End: 2015-09-07
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
## 2015-09-11 ENCOUNTER — Other Ambulatory Visit: Payer: Self-pay | Admitting: Physician Assistant

## 2015-09-13 ENCOUNTER — Other Ambulatory Visit: Payer: Self-pay | Admitting: Physician Assistant

## 2015-09-15 ENCOUNTER — Other Ambulatory Visit (HOSPITAL_COMMUNITY): Payer: Self-pay | Admitting: Psychiatry

## 2015-09-18 NOTE — Telephone Encounter (Signed)
Received medication from [facility] for Celexa. Per Dr. Gilmore LarocheAkhtar, medication request is denied. Pt received a printed rx for Celexa 10mg , #45 w/ 1 refill on 09/07/15. Pt is schedule for a f/u appt on 10/24/15.

## 2015-09-22 ENCOUNTER — Encounter: Payer: Self-pay | Admitting: Physician Assistant

## 2015-09-22 MED ORDER — FLUCONAZOLE 150 MG PO TABS: 150.0000 mg | ORAL_TABLET | Freq: Once | ORAL | Status: DC

## 2015-09-24 NOTE — Progress Notes (Signed)
This encounter was created in error - please disregard.

## 2015-10-03 ENCOUNTER — Encounter: Payer: Self-pay | Admitting: Physician Assistant

## 2015-10-03 ENCOUNTER — Ambulatory Visit (INDEPENDENT_AMBULATORY_CARE_PROVIDER_SITE_OTHER): Payer: Federal, State, Local not specified - PPO | Admitting: Physician Assistant

## 2015-10-03 VITALS — BP 123/78 | HR 84 | Temp 97.9°F | Ht 67.0 in | Wt 163.0 lb

## 2015-10-03 DIAGNOSIS — J0100 Acute maxillary sinusitis, unspecified: Secondary | ICD-10-CM | POA: Diagnosis not present

## 2015-10-03 MED ORDER — AZITHROMYCIN 250 MG PO TABS: ORAL_TABLET | ORAL | Status: DC

## 2015-10-03 MED ORDER — METHYLPREDNISOLONE 4 MG PO TBPK: ORAL_TABLET | ORAL | Status: DC

## 2015-10-03 NOTE — Progress Notes (Signed)
   Subjective:    Patient ID: Morgan Miller, female    DOB: 10/22/1980, 35 y.o.   MRN: [id number]  HPI  Pt is a 35 yo female who presents to the clinic with 5 days of sinus pressure, cough, ear pain, headache. She is having productive green discharge from nostril. Low grade fever on Sunday night but no fever today. No SOB or wheezing. mucinex and tylenol with little relief. Her son had double ear infection/sinusitis last week.   Review of Systems  All other systems reviewed and are negative.      Objective:   Physical Exam  Constitutional: She is oriented to person, place, and time. She appears well-developed and well-nourished.  HENT:  Head: Normocephalic and atraumatic.  Right Ear: External ear normal.  Left Ear: External ear normal.  Mouth/Throat: Oropharynx is clear and moist. No oropharyngeal exudate.  TM"s erythematous with +bilateral light reflex.  Tenderness over maxillary sinuses to palpation.  Bilateral nasal turbinates red and swollen with rhinorrhea.   Eyes: Conjunctivae are normal. Right eye exhibits no discharge. Left eye exhibits no discharge.  Neck: Normal range of motion. Neck supple.  Cardiovascular: Normal rate, regular rhythm and normal heart sounds.   Pulmonary/Chest: Effort normal and breath sounds normal. She has no wheezes.  Lymphadenopathy:    She has no cervical adenopathy.  Neurological: She is alert and oriented to person, place, and time.  Psychiatric: She has a normal mood and affect. Her behavior is normal.          Assessment & Plan:  Acute maxillary sinusitis- due to PCN allergy treated with zpak. Appears like a lot of inflammation given medrol dose pak. Continues mucinex and flonase. Follow up as needed.

## 2015-10-03 NOTE — Patient Instructions (Signed)
Sinusitis, Adult Sinusitis is redness, soreness, and inflammation of the paranasal sinuses. Paranasal sinuses are air pockets within the bones of your face. They are located beneath your eyes, in the middle of your forehead, and above your eyes. In healthy paranasal sinuses, mucus is able to drain out, and air is able to circulate through them by way of your nose. However, when your paranasal sinuses are inflamed, mucus and air can become trapped. This can allow bacteria and other germs to grow and cause infection. Sinusitis can develop quickly and last only a short time (acute) or continue over a long period (chronic). Sinusitis that lasts for more than 12 weeks is considered chronic. CAUSES Causes of sinusitis include:  Allergies.  Structural abnormalities, such as displacement of the cartilage that separates your nostrils (deviated septum), which can decrease the air flow through your nose and sinuses and affect sinus drainage.  Functional abnormalities, such as when the small hairs (cilia) that line your sinuses and help remove mucus do not work properly or are not present. SIGNS AND SYMPTOMS Symptoms of acute and chronic sinusitis are the same. The primary symptoms are pain and pressure around the affected sinuses. Other symptoms include:  Upper toothache.  Earache.  Headache.  Bad breath.  Decreased sense of smell and taste.  A cough, which worsens when you are lying flat.  Fatigue.  Fever.  Thick drainage from your nose, which often is green and may contain pus (purulent).  Swelling and warmth over the affected sinuses. DIAGNOSIS Your health care provider will perform a physical exam. During your exam, your health care provider may perform any of the following to help determine if you have acute sinusitis or chronic sinusitis:  Look in your nose for signs of abnormal growths in your nostrils (nasal polyps).  Tap over the affected sinus to check for signs of  infection.  View the inside of your sinuses using an imaging device that has a light attached (endoscope). If your health care provider suspects that you have chronic sinusitis, one or more of the following tests may be recommended:  Allergy tests.  Nasal culture. A sample of mucus is taken from your nose, sent to a lab, and screened for bacteria.  Nasal cytology. A sample of mucus is taken from your nose and examined by your health care provider to determine if your sinusitis is related to an allergy. TREATMENT Most cases of acute sinusitis are related to a viral infection and will resolve on their own within 10 days. Sometimes, medicines are prescribed to help relieve symptoms of both acute and chronic sinusitis. These may include pain medicines, decongestants, nasal steroid sprays, or saline sprays. However, for sinusitis related to a bacterial infection, your health care provider will prescribe antibiotic medicines. These are medicines that will help kill the bacteria causing the infection. Rarely, sinusitis is caused by a fungal infection. In these cases, your health care provider will prescribe antifungal medicine. For some cases of chronic sinusitis, surgery is needed. Generally, these are cases in which sinusitis recurs more than 3 times per year, despite other treatments. HOME CARE INSTRUCTIONS  Drink plenty of water. Water helps thin the mucus so your sinuses can drain more easily.  Use a humidifier.  Inhale steam 3-4 times a day (for example, sit in the bathroom with the shower running).  Apply a warm, moist washcloth to your face 3-4 times a day, or as directed by your health care provider.  Use saline nasal sprays to help   moisten and clean your sinuses.  Take medicines only as directed by your health care provider.  If you were prescribed either an antibiotic or antifungal medicine, finish it all even if you start to feel better. SEEK IMMEDIATE MEDICAL CARE IF:  You have  increasing pain or severe headaches.  You have nausea, vomiting, or drowsiness.  You have swelling around your face.  You have vision problems.  You have a stiff neck.  You have difficulty breathing.   This information is not intended to replace advice given to you by your health care provider. Make sure you discuss any questions you have with your health care provider.   Document Released: 03/25/2005 Document Revised: 04/15/2014 Document Reviewed: 04/09/2011 Elsevier Interactive Patient Education 2016 [employer] Place sinusitis patient instructions here.

## 2015-10-04 ENCOUNTER — Other Ambulatory Visit: Payer: Self-pay | Admitting: Physician Assistant

## 2015-10-04 MED ORDER — HYDROCOD POLST-CPM POLST ER 10-8 MG/5ML PO SUER: 5.0000 mL | Freq: Two times a day (BID) | ORAL | Status: DC | PRN

## 2015-10-06 ENCOUNTER — Other Ambulatory Visit: Payer: Self-pay | Admitting: Physician Assistant

## 2015-10-06 MED ORDER — LEVOFLOXACIN 500 MG PO TABS: 500.0000 mg | ORAL_TABLET | Freq: Every day | ORAL | Status: DC

## 2015-10-24 ENCOUNTER — Ambulatory Visit (HOSPITAL_COMMUNITY): Payer: Self-pay | Admitting: Psychiatry

## 2015-10-27 ENCOUNTER — Encounter (HOSPITAL_COMMUNITY): Payer: Self-pay | Admitting: Psychiatry

## 2015-10-27 ENCOUNTER — Ambulatory Visit (INDEPENDENT_AMBULATORY_CARE_PROVIDER_SITE_OTHER): Payer: Federal, State, Local not specified - PPO | Admitting: Psychiatry

## 2015-10-27 DIAGNOSIS — F410 Panic disorder [episodic paroxysmal anxiety] without agoraphobia: Secondary | ICD-10-CM | POA: Diagnosis not present

## 2015-10-27 DIAGNOSIS — F900 Attention-deficit hyperactivity disorder, predominantly inattentive type: Secondary | ICD-10-CM

## 2015-10-27 DIAGNOSIS — F4310 Post-traumatic stress disorder, unspecified: Secondary | ICD-10-CM | POA: Diagnosis not present

## 2015-10-27 DIAGNOSIS — F331 Major depressive disorder, recurrent, moderate: Secondary | ICD-10-CM | POA: Diagnosis not present

## 2015-10-27 MED ORDER — CITALOPRAM HYDROBROMIDE 10 MG PO TABS: 15.0000 mg | ORAL_TABLET | Freq: Every day | ORAL | Status: DC

## 2015-10-27 MED ORDER — AMPHETAMINE-DEXTROAMPHETAMINE 15 MG PO TABS: 15.0000 mg | ORAL_TABLET | Freq: Every day | ORAL | Status: DC

## 2015-10-27 MED ORDER — AMPHETAMINE-DEXTROAMPHETAMINE 15 MG PO TABS
15.0000 mg | ORAL_TABLET | Freq: Every day | ORAL | Status: DC
Start: 2015-10-27 — End: 2015-12-07
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
## 2015-11-10 ENCOUNTER — Encounter: Payer: Self-pay | Admitting: Physician Assistant

## 2015-11-10 ENCOUNTER — Other Ambulatory Visit: Payer: Self-pay

## 2015-11-10 MED ORDER — ALPRAZOLAM 0.5 MG PO TABS: ORAL_TABLET | ORAL | 0 refills | Status: DC

## 2015-11-20 DIAGNOSIS — N760 Acute vaginitis: Secondary | ICD-10-CM | POA: Diagnosis not present

## 2015-11-20 DIAGNOSIS — N898 Other specified noninflammatory disorders of vagina: Secondary | ICD-10-CM | POA: Diagnosis not present

## 2015-11-20 DIAGNOSIS — M545 Low back pain: Secondary | ICD-10-CM | POA: Diagnosis not present

## 2015-11-23 ENCOUNTER — Ambulatory Visit: Payer: Self-pay | Admitting: Physician Assistant

## 2015-11-24 ENCOUNTER — Ambulatory Visit (INDEPENDENT_AMBULATORY_CARE_PROVIDER_SITE_OTHER): Payer: Federal, State, Local not specified - PPO | Admitting: Physician Assistant

## 2015-11-24 ENCOUNTER — Encounter: Payer: Self-pay | Admitting: Physician Assistant

## 2015-11-24 VITALS — BP 116/64 | HR 79 | Ht 67.0 in | Wt 163.0 lb

## 2015-11-24 DIAGNOSIS — F410 Panic disorder [episodic paroxysmal anxiety] without agoraphobia: Secondary | ICD-10-CM

## 2015-11-24 DIAGNOSIS — F4310 Post-traumatic stress disorder, unspecified: Secondary | ICD-10-CM | POA: Diagnosis not present

## 2015-11-24 DIAGNOSIS — F4323 Adjustment disorder with mixed anxiety and depressed mood: Secondary | ICD-10-CM | POA: Diagnosis not present

## 2015-11-24 MED ORDER — ALPRAZOLAM 0.5 MG PO TABS: ORAL_TABLET | ORAL | 5 refills | Status: DC

## 2015-11-24 NOTE — Patient Instructions (Signed)
Doxepin for sleep and nightmares. Ask Dr. Clydene PughAdkar.

## 2015-11-24 NOTE — Progress Notes (Signed)
   Subjective:    Patient ID: Morgan Miller, female    DOB: 11/12/1980, 35 y.o.   MRN: [id number]  HPI Patient is a 35 year old female who presents to the clinic for refill on her Xanax. She does see behavioral health [facility] however he does not prescribe her Xanax. She feels like her PTSD and panic is better but not completely resolved. She still has a lot of trouble sleeping and having a lot of recent nightmares. She does admit to taking Xanax twice a day most days. She denies any suicidal or homicidal thoughts. She is still having problems with her husband and their relationship. They currently still live together but are considering him moving out.   Review of Systems See history of present illness    Objective:   Physical Exam  Constitutional: She is oriented to person, place, and time. She appears well-developed and well-nourished.  HENT:  Head: Normocephalic and atraumatic.  Cardiovascular: Normal rate, regular rhythm and normal heart sounds.   Pulmonary/Chest: Effort normal and breath sounds normal.  Neurological: She is alert and oriented to person, place, and time.  Psychiatric: She has a normal mood and affect. Her behavior is normal.          Assessment & Plan:  PTSD/panic disorder/Adjustment disorder-I did refill Xanax no more than twice a day. We did have a long discussion about only using Xanax sparingly and as needed. She is aware that this is just blocking her senses and not allowing her to cope with her current situation. I strongly encouraged counseling for herself and counseling together. When she talks about her relationship with her husband I feel like they could work this out if both parties desired this. I encouraged her to follow-up with behavioral health downstairs to discuss sleep medication. She says the trazodone does not work. She is currently using Unisom and/or Xanax. With her increase of nightmares and might be beneficial to consider doxepin. Follow-up behavioral  health maintenance decision. Follow-up in 6 months.

## 2015-12-06 ENCOUNTER — Telehealth (HOSPITAL_COMMUNITY): Payer: Self-pay | Admitting: *Deleted

## 2015-12-06 NOTE — Telephone Encounter (Signed)
Pt appt for 9/1 was reschedule due to provider being out of office. Prescription was written on 10/27/15 for Adderall 15mg , #30 w/ 0 refills. Pt will need a prescription written for Adderall 15mg . Please call once prescription is ready for pickup. Pt f/u appt is schedule for 12/12/15.

## 2015-12-07 MED ORDER — AMPHETAMINE-DEXTROAMPHETAMINE 15 MG PO TABS: 15.0000 mg | ORAL_TABLET | Freq: Every day | ORAL | 0 refills | Status: DC

## 2015-12-07 NOTE — Telephone Encounter (Signed)
adderall printed for pick up 

## 2015-12-08 ENCOUNTER — Ambulatory Visit (HOSPITAL_COMMUNITY): Payer: Self-pay | Admitting: Psychiatry

## 2015-12-12 ENCOUNTER — Encounter (HOSPITAL_COMMUNITY): Payer: Self-pay | Admitting: Psychiatry

## 2015-12-12 ENCOUNTER — Ambulatory Visit (INDEPENDENT_AMBULATORY_CARE_PROVIDER_SITE_OTHER): Payer: Federal, State, Local not specified - PPO | Admitting: Psychiatry

## 2015-12-12 DIAGNOSIS — F410 Panic disorder [episodic paroxysmal anxiety] without agoraphobia: Secondary | ICD-10-CM | POA: Diagnosis not present

## 2015-12-12 DIAGNOSIS — F900 Attention-deficit hyperactivity disorder, predominantly inattentive type: Secondary | ICD-10-CM | POA: Diagnosis not present

## 2015-12-12 DIAGNOSIS — F4310 Post-traumatic stress disorder, unspecified: Secondary | ICD-10-CM

## 2015-12-12 MED ORDER — AMPHETAMINE-DEXTROAMPHETAMINE 15 MG PO TABS: 15.0000 mg | ORAL_TABLET | Freq: Every day | ORAL | 0 refills | Status: DC

## 2015-12-12 MED ORDER — CITALOPRAM HYDROBROMIDE 10 MG PO TABS: 15.0000 mg | ORAL_TABLET | Freq: Every day | ORAL | 1 refills | Status: DC

## 2015-12-12 MED ORDER — CITALOPRAM HYDROBROMIDE 20 MG PO TABS: 20.0000 mg | ORAL_TABLET | Freq: Every day | ORAL | 1 refills | Status: DC
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
## 2015-12-14 ENCOUNTER — Telehealth (HOSPITAL_COMMUNITY): Payer: Self-pay | Admitting: Psychiatry

## 2015-12-14 NOTE — Telephone Encounter (Signed)
Pt would like a increase to her adderall rx to 20mg . Pt was here on Tuesday.  Please advise

## 2015-12-15 ENCOUNTER — Encounter: Payer: Self-pay | Admitting: Physician Assistant

## 2015-12-15 DIAGNOSIS — Z3200 Encounter for pregnancy test, result unknown: Secondary | ICD-10-CM | POA: Diagnosis not present

## 2015-12-15 NOTE — Telephone Encounter (Signed)
Informed patient. Patient showed understanding. Nothing further needed at this time.

## 2015-12-15 NOTE — Telephone Encounter (Signed)
Do not recommend to increase adderall further. It may even make her anxiety worse.

## 2015-12-19 DIAGNOSIS — Z3200 Encounter for pregnancy test, result unknown: Secondary | ICD-10-CM | POA: Diagnosis not present

## 2015-12-20 ENCOUNTER — Telehealth (HOSPITAL_COMMUNITY): Payer: Self-pay | Admitting: Psychiatry

## 2015-12-20 ENCOUNTER — Other Ambulatory Visit: Payer: Self-pay | Admitting: Physician Assistant

## 2015-12-20 NOTE — Telephone Encounter (Signed)
pt would like to take 4-6wks off. she is having to go every 2-3 days for blood work since she is pregant. she needs a letter wrote to her work for this. she is under a lot of stress. per pt dr Gilmore Larocheakhtar knows about her past with miscarraiges. Leaving work and going back is causeing a lot of stress on her. cb at [phone].   Please advise

## 2015-12-22 ENCOUNTER — Encounter (HOSPITAL_COMMUNITY): Payer: Self-pay | Admitting: Psychiatry

## 2015-12-22 NOTE — Telephone Encounter (Signed)
Return telephone call to pt. Pt informed office pcp wrote pt out of work for 4 weeks. Pt states she has tapered herself off all medications. Pt f/u appt is schedule for 01/11/16.

## 2015-12-22 NOTE — Telephone Encounter (Signed)
Per report patient has stopped meds. Including celexa and adderall.  She needs to understand its Category C  Can take off from work for 2 weeks maximum as of now Follow up with obgyn closely for progress.

## 2015-12-26 DIAGNOSIS — Z3200 Encounter for pregnancy test, result unknown: Secondary | ICD-10-CM | POA: Diagnosis not present

## 2016-01-04 DIAGNOSIS — O0901 Supervision of pregnancy with history of infertility, first trimester: Secondary | ICD-10-CM | POA: Diagnosis not present

## 2016-01-04 DIAGNOSIS — Z3A00 Weeks of gestation of pregnancy not specified: Secondary | ICD-10-CM | POA: Diagnosis not present

## 2016-01-11 ENCOUNTER — Ambulatory Visit (HOSPITAL_COMMUNITY): Payer: Self-pay | Admitting: Psychiatry

## 2016-01-15 ENCOUNTER — Ambulatory Visit (HOSPITAL_COMMUNITY): Payer: Federal, State, Local not specified - PPO | Admitting: Psychiatry

## 2016-01-18 DIAGNOSIS — Z3A00 Weeks of gestation of pregnancy not specified: Secondary | ICD-10-CM | POA: Diagnosis not present

## 2016-01-18 DIAGNOSIS — O0901 Supervision of pregnancy with history of infertility, first trimester: Secondary | ICD-10-CM | POA: Diagnosis not present

## 2016-01-19 ENCOUNTER — Ambulatory Visit (INDEPENDENT_AMBULATORY_CARE_PROVIDER_SITE_OTHER): Payer: Federal, State, Local not specified - PPO | Admitting: Psychiatry

## 2016-01-19 ENCOUNTER — Encounter (HOSPITAL_COMMUNITY): Payer: Self-pay | Admitting: Psychiatry

## 2016-01-19 VITALS — BP 124/72 | Resp 14 | Ht 67.0 in | Wt 165.2 lb

## 2016-01-19 DIAGNOSIS — Z818 Family history of other mental and behavioral disorders: Secondary | ICD-10-CM

## 2016-01-19 DIAGNOSIS — Z8249 Family history of ischemic heart disease and other diseases of the circulatory system: Secondary | ICD-10-CM

## 2016-01-19 DIAGNOSIS — F410 Panic disorder [episodic paroxysmal anxiety] without agoraphobia: Secondary | ICD-10-CM

## 2016-01-19 DIAGNOSIS — F4310 Post-traumatic stress disorder, unspecified: Secondary | ICD-10-CM

## 2016-01-19 DIAGNOSIS — F331 Major depressive disorder, recurrent, moderate: Secondary | ICD-10-CM | POA: Diagnosis not present

## 2016-01-19 DIAGNOSIS — F900 Attention-deficit hyperactivity disorder, predominantly inattentive type: Secondary | ICD-10-CM

## 2016-01-19 DIAGNOSIS — Z8489 Family history of other specified conditions: Secondary | ICD-10-CM

## 2016-01-19 DIAGNOSIS — Z811 Family history of alcohol abuse and dependence: Secondary | ICD-10-CM

## 2016-01-19 DIAGNOSIS — Z87891 Personal history of nicotine dependence: Secondary | ICD-10-CM

## 2016-01-19 MED ORDER — AMPHETAMINE-DEXTROAMPHETAMINE 15 MG PO TABS: 7.5000 mg | ORAL_TABLET | Freq: Every day | ORAL | 0 refills | Status: DC
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
## 2016-02-09 DIAGNOSIS — Z3A11 11 weeks gestation of pregnancy: Secondary | ICD-10-CM | POA: Diagnosis not present

## 2016-02-09 DIAGNOSIS — O34219 Maternal care for unspecified type scar from previous cesarean delivery: Secondary | ICD-10-CM | POA: Diagnosis not present

## 2016-02-09 DIAGNOSIS — O09521 Supervision of elderly multigravida, first trimester: Secondary | ICD-10-CM | POA: Diagnosis not present

## 2016-02-16 ENCOUNTER — Ambulatory Visit (HOSPITAL_COMMUNITY): Payer: Self-pay | Admitting: Psychiatry

## 2016-02-19 DIAGNOSIS — Z3A11 11 weeks gestation of pregnancy: Secondary | ICD-10-CM | POA: Diagnosis not present

## 2016-02-20 ENCOUNTER — Ambulatory Visit (INDEPENDENT_AMBULATORY_CARE_PROVIDER_SITE_OTHER): Payer: Federal, State, Local not specified - PPO | Admitting: Psychiatry

## 2016-02-20 ENCOUNTER — Encounter (HOSPITAL_COMMUNITY): Payer: Self-pay | Admitting: Psychiatry

## 2016-02-20 VITALS — BP 124/70 | HR 93 | Resp 16 | Ht 67.0 in | Wt 170.0 lb

## 2016-02-20 DIAGNOSIS — F331 Major depressive disorder, recurrent, moderate: Secondary | ICD-10-CM | POA: Diagnosis not present

## 2016-02-20 DIAGNOSIS — F410 Panic disorder [episodic paroxysmal anxiety] without agoraphobia: Secondary | ICD-10-CM | POA: Diagnosis not present

## 2016-02-20 DIAGNOSIS — Z8249 Family history of ischemic heart disease and other diseases of the circulatory system: Secondary | ICD-10-CM

## 2016-02-20 DIAGNOSIS — O09521 Supervision of elderly multigravida, first trimester: Secondary | ICD-10-CM | POA: Diagnosis not present

## 2016-02-20 DIAGNOSIS — Z87891 Personal history of nicotine dependence: Secondary | ICD-10-CM

## 2016-02-20 DIAGNOSIS — Z818 Family history of other mental and behavioral disorders: Secondary | ICD-10-CM

## 2016-02-20 DIAGNOSIS — Z8349 Family history of other endocrine, nutritional and metabolic diseases: Secondary | ICD-10-CM

## 2016-02-20 DIAGNOSIS — F900 Attention-deficit hyperactivity disorder, predominantly inattentive type: Secondary | ICD-10-CM | POA: Diagnosis not present

## 2016-02-20 DIAGNOSIS — O34219 Maternal care for unspecified type scar from previous cesarean delivery: Secondary | ICD-10-CM | POA: Diagnosis not present

## 2016-02-20 DIAGNOSIS — F4310 Post-traumatic stress disorder, unspecified: Secondary | ICD-10-CM

## 2016-02-20 DIAGNOSIS — Z811 Family history of alcohol abuse and dependence: Secondary | ICD-10-CM

## 2016-02-20 DIAGNOSIS — Z3A12 12 weeks gestation of pregnancy: Secondary | ICD-10-CM | POA: Diagnosis not present

## 2016-02-20 DIAGNOSIS — Z79899 Other long term (current) drug therapy: Secondary | ICD-10-CM

## 2016-02-20 DIAGNOSIS — Z3682 Encounter for antenatal screening for nuchal translucency: Secondary | ICD-10-CM | POA: Diagnosis not present

## 2016-02-20 DIAGNOSIS — Z833 Family history of diabetes mellitus: Secondary | ICD-10-CM

## 2016-02-20 MED ORDER — AMPHETAMINE-DEXTROAMPHETAMINE 15 MG PO TABS: 7.5000 mg | ORAL_TABLET | Freq: Every day | ORAL | 0 refills | Status: DC
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
## 2016-03-03 DIAGNOSIS — O26892 Other specified pregnancy related conditions, second trimester: Secondary | ICD-10-CM | POA: Diagnosis not present

## 2016-03-03 DIAGNOSIS — Z3A14 14 weeks gestation of pregnancy: Secondary | ICD-10-CM | POA: Diagnosis not present

## 2016-03-03 DIAGNOSIS — O26852 Spotting complicating pregnancy, second trimester: Secondary | ICD-10-CM | POA: Diagnosis not present

## 2016-03-04 DIAGNOSIS — O09521 Supervision of elderly multigravida, first trimester: Secondary | ICD-10-CM | POA: Diagnosis not present

## 2016-03-04 DIAGNOSIS — Z3A14 14 weeks gestation of pregnancy: Secondary | ICD-10-CM | POA: Diagnosis not present

## 2016-03-08 DIAGNOSIS — O34219 Maternal care for unspecified type scar from previous cesarean delivery: Secondary | ICD-10-CM | POA: Diagnosis not present

## 2016-03-08 DIAGNOSIS — O09292 Supervision of pregnancy with other poor reproductive or obstetric history, second trimester: Secondary | ICD-10-CM | POA: Diagnosis not present

## 2016-03-08 DIAGNOSIS — O09522 Supervision of elderly multigravida, second trimester: Secondary | ICD-10-CM | POA: Diagnosis not present

## 2016-03-08 DIAGNOSIS — Z3A15 15 weeks gestation of pregnancy: Secondary | ICD-10-CM | POA: Diagnosis not present

## 2016-03-08 DIAGNOSIS — O0942 Supervision of pregnancy with grand multiparity, second trimester: Secondary | ICD-10-CM | POA: Diagnosis not present

## 2016-03-15 ENCOUNTER — Ambulatory Visit (INDEPENDENT_AMBULATORY_CARE_PROVIDER_SITE_OTHER): Payer: Federal, State, Local not specified - PPO | Admitting: Psychiatry

## 2016-03-15 ENCOUNTER — Encounter (HOSPITAL_COMMUNITY): Payer: Self-pay | Admitting: Psychiatry

## 2016-03-15 VITALS — Ht 67.0 in | Wt 170.0 lb

## 2016-03-15 DIAGNOSIS — F331 Major depressive disorder, recurrent, moderate: Secondary | ICD-10-CM | POA: Diagnosis not present

## 2016-03-15 DIAGNOSIS — Z888 Allergy status to other drugs, medicaments and biological substances status: Secondary | ICD-10-CM

## 2016-03-15 DIAGNOSIS — F410 Panic disorder [episodic paroxysmal anxiety] without agoraphobia: Secondary | ICD-10-CM | POA: Diagnosis not present

## 2016-03-15 DIAGNOSIS — Z87891 Personal history of nicotine dependence: Secondary | ICD-10-CM

## 2016-03-15 DIAGNOSIS — Z818 Family history of other mental and behavioral disorders: Secondary | ICD-10-CM

## 2016-03-15 DIAGNOSIS — Z880 Allergy status to penicillin: Secondary | ICD-10-CM

## 2016-03-15 DIAGNOSIS — F4310 Post-traumatic stress disorder, unspecified: Secondary | ICD-10-CM

## 2016-03-15 DIAGNOSIS — Z882 Allergy status to sulfonamides status: Secondary | ICD-10-CM

## 2016-03-15 DIAGNOSIS — Z8249 Family history of ischemic heart disease and other diseases of the circulatory system: Secondary | ICD-10-CM

## 2016-03-15 DIAGNOSIS — Z79899 Other long term (current) drug therapy: Secondary | ICD-10-CM

## 2016-03-15 DIAGNOSIS — Z833 Family history of diabetes mellitus: Secondary | ICD-10-CM

## 2016-03-15 DIAGNOSIS — Z811 Family history of alcohol abuse and dependence: Secondary | ICD-10-CM

## 2016-03-15 MED ORDER — AMPHETAMINE-DEXTROAMPHETAMINE 15 MG PO TABS: 7.5000 mg | ORAL_TABLET | Freq: Every day | ORAL | 0 refills | Status: DC

## 2016-03-15 NOTE — Progress Notes (Signed)
BH MD/PA/NP OP Progress Note  03/15/2016 11:56 AM Morgan Miller  MRN:  [id number]  Chief Complaint:  Chief Complaint    Follow-up     Subjective:  Depression . Follow up  HPI: History of Present Illness:  Patient is a 35 years old [place] American descent married female referred initially  by primary care physician for anxiety depression and possible PTSD. Also been diagnosed with ADHD.  Morgan CottonJosephine returns for follow-up she is doing reasonable she is pregnant [redacted] weeks pregnancy is going on fine. She still has to take Adderall half a tablet otherwise she has become distracted she is working full-time but is not taking the stress about her legal case relationship with her husband also going better depression is improved  She is trying to focus on pregnancy Aggravating factors; job stress legal case Modifying factor her husband     Visit Diagnosis:    ICD-9-CM ICD-10-CM   1. Moderate episode of recurrent major depressive disorder (HCC) 296.32 F33.1   2. PTSD (post-traumatic stress disorder) 309.81 F43.10   3. Panic disorder 300.01 F41.0     Past Psychiatric History: depression  Past Medical History:  Past Medical History:  Diagnosis Date  . Complication of anesthesia    epidural caused syncope  . Hypertension   . Preeclampsia in postpartum period     Past Surgical History:  Procedure Laterality Date  . CESAREAN SECTION       Family History:  Family History  Problem Relation Age of Onset  . Hyperlipidemia Mother   . Depression Mother   . Anxiety disorder Mother   . Diabetes Father   . Hyperlipidemia Father   . Hypertension Father   . Parkinson's disease Father   . Alcohol abuse Paternal Uncle   . Heart attack Paternal Grandfather     Social History:  Social History   Social History  . Marital status: Married    Spouse name: N/A  . Number of children: N/A  . Years of education: N/A   Occupational History  . supervisor    Social History Main Topics  .  Smoking status: Former Smoker    Packs/day: 0.30    Types: Cigarettes  . Smokeless tobacco: Never Used  . Alcohol use No     Comment: Every couple of weeks  . Drug use: No  . Sexual activity: Yes    Partners: Male   Other Topics Concern  . None   Social History Narrative  . None    Allergies:  Allergies  Allergen Reactions  . Codeine Nausea Only  . Nifedipine     fatigue  . Penicillins   . Sulfamethoxazole Nausea And Vomiting    Metabolic Disorder Labs: Lab Results  Component Value Date   HGBA1C 5.7 (H) 07/14/2015   MPG 117 07/14/2015   MPG 126 (H) 07/25/2014   No results found for: PROLACTIN Lab Results  Component Value Date   CHOL 193 06/25/2013   TRIG 122 06/25/2013   HDL 48 06/25/2013   CHOLHDL 4.0 06/25/2013   VLDL 24 06/25/2013   LDLCALC 121 (H) 06/25/2013     Current Medications: Current Outpatient Prescriptions  Medication Sig Dispense Refill  . ALPRAZolam (XANAX) 0.5 MG tablet TAKE 1 TABLET BY MOUTH 2 TIMES DAILY AS NEEDED (Patient not taking: Reported on 02/20/2016) 60 tablet 5  . amphetamine-dextroamphetamine (ADDERALL) 15 MG tablet Take 0.5 tablets by mouth daily. Take half to one divided dose a day 30 tablet 0  . citalopram (CELEXA) 10  MG tablet Take 1.5 tablets (15 mg total) by mouth daily. (Patient not taking: Reported on 02/20/2016) 45 tablet 1  . citalopram (CELEXA) 20 MG tablet Take 1 tablet (20 mg total) by mouth daily. (Patient not taking: Reported on 02/20/2016) 30 tablet 1  . DiphenhydrAMINE HCl, Sleep, (UNISOM SLEEPGELS) 50 MG CAPS Take one tablet at bedtime. (Patient not taking: Reported on 02/20/2016) 30 each 11  . Doxylamine-Pyridoxine 10-10 MG TBEC Take 10 mg by mouth.    . Multiple Vitamin (MULTIVITAMIN) capsule Take by mouth.    . progesterone (PROMETRIUM) 100 MG capsule Place 1 capsule vaginally at bedtime.     No current facility-administered medications for this visit.       Psychiatric Specialty Exam: Review of Systems   Constitutional: Negative for fever.  Cardiovascular: Negative for chest pain.  Gastrointestinal: Negative for nausea.  Skin: Negative for rash.  Psychiatric/Behavioral: Negative for depression and suicidal ideas.    Height 5\' 7"  (1.702 m), weight 170 lb (77.1 kg), last menstrual period 11/22/2015.Body mass index is 26.63 kg/m.  General Appearance: Casual  Eye Contact:  Fair  Speech:  Normal Rate  Volume:  Decreased  Mood: euthymic  Affect:  reactive  Thought Process:  Goal Directed  Orientation:  Full (Time, Place, and Person)  Thought Content: Rumination   Suicidal Thoughts:  No  Homicidal Thoughts:  No  Memory:  Immediate;   Fair Recent;   Good  Judgement:  Fair  Insight:  Fair  Psychomotor Activity:  Normal  Concentration:  Concentration: Fair and Attention Span: Fair  Recall:  [employer]  Fund of Knowledge: Fair  Language: Fair  Akathisia:  Negative  Handed:  Right  AIMS (if indicated):    Assets:  Desire for Improvement  ADL's:  Intact  Cognition: WNL  Sleep:  Fair to variable     Treatment Plan Summary:Medication management and Plan as follows   1. ADHD: continue low dose adderall . Prescription written 2. PTSD: not worsened 3. Depression: not worsened   No reported side effects Continue therapy and low stress job for now [employer]  when she need.  FU in 6 weeks  Thresa RossAKHTAR, Shantil Vallejo, MD 03/15/2016, 11:56 AM

## 2016-04-05 DIAGNOSIS — Z3A19 19 weeks gestation of pregnancy: Secondary | ICD-10-CM | POA: Diagnosis not present

## 2016-04-05 DIAGNOSIS — O09522 Supervision of elderly multigravida, second trimester: Secondary | ICD-10-CM | POA: Diagnosis not present

## 2016-04-05 DIAGNOSIS — O34219 Maternal care for unspecified type scar from previous cesarean delivery: Secondary | ICD-10-CM | POA: Diagnosis not present

## 2016-04-05 DIAGNOSIS — Z363 Encounter for antenatal screening for malformations: Secondary | ICD-10-CM | POA: Diagnosis not present

## 2016-04-09 ENCOUNTER — Telehealth (HOSPITAL_COMMUNITY): Payer: Self-pay | Admitting: Psychiatry

## 2016-04-09 NOTE — Telephone Encounter (Signed)
Pt request refill on adderall

## 2016-04-10 MED ORDER — AMPHETAMINE-DEXTROAMPHETAMINE 15 MG PO TABS: 7.5000 mg | ORAL_TABLET | Freq: Every day | ORAL | 0 refills | Status: DC

## 2016-04-10 NOTE — Telephone Encounter (Signed)
Lvm informing pt Adderall rx will be ready for pickup after lunch on 1/4.

## 2016-04-10 NOTE — Telephone Encounter (Signed)
Medication refill- pt contact office requesting a refill for Adderall 15mg . Last Adderall Rx was written on 03/15/16. Pt's next appointment is schedule on 05/10/16.

## 2016-04-10 NOTE — Telephone Encounter (Signed)
Printed adderall for pick up.  

## 2016-04-26 ENCOUNTER — Ambulatory Visit (INDEPENDENT_AMBULATORY_CARE_PROVIDER_SITE_OTHER): Payer: Federal, State, Local not specified - PPO | Admitting: Psychiatry

## 2016-04-26 ENCOUNTER — Encounter (HOSPITAL_COMMUNITY): Payer: Self-pay | Admitting: Psychiatry

## 2016-04-26 VITALS — BP 128/85 | HR 78 | Ht 67.0 in | Wt 176.0 lb

## 2016-04-26 DIAGNOSIS — F410 Panic disorder [episodic paroxysmal anxiety] without agoraphobia: Secondary | ICD-10-CM | POA: Diagnosis not present

## 2016-04-26 DIAGNOSIS — Z8489 Family history of other specified conditions: Secondary | ICD-10-CM

## 2016-04-26 DIAGNOSIS — F331 Major depressive disorder, recurrent, moderate: Secondary | ICD-10-CM

## 2016-04-26 DIAGNOSIS — F900 Attention-deficit hyperactivity disorder, predominantly inattentive type: Secondary | ICD-10-CM

## 2016-04-26 DIAGNOSIS — Z818 Family history of other mental and behavioral disorders: Secondary | ICD-10-CM

## 2016-04-26 DIAGNOSIS — F4310 Post-traumatic stress disorder, unspecified: Secondary | ICD-10-CM | POA: Diagnosis not present

## 2016-04-26 DIAGNOSIS — Z811 Family history of alcohol abuse and dependence: Secondary | ICD-10-CM

## 2016-04-26 DIAGNOSIS — Z882 Allergy status to sulfonamides status: Secondary | ICD-10-CM

## 2016-04-26 DIAGNOSIS — Z888 Allergy status to other drugs, medicaments and biological substances status: Secondary | ICD-10-CM

## 2016-04-26 DIAGNOSIS — Z880 Allergy status to penicillin: Secondary | ICD-10-CM

## 2016-04-26 DIAGNOSIS — Z833 Family history of diabetes mellitus: Secondary | ICD-10-CM

## 2016-04-26 DIAGNOSIS — Z87891 Personal history of nicotine dependence: Secondary | ICD-10-CM

## 2016-04-26 DIAGNOSIS — Z8249 Family history of ischemic heart disease and other diseases of the circulatory system: Secondary | ICD-10-CM

## 2016-04-26 DIAGNOSIS — Z79899 Other long term (current) drug therapy: Secondary | ICD-10-CM

## 2016-04-26 MED ORDER — AMPHETAMINE-DEXTROAMPHETAMINE 15 MG PO TABS: 7.5000 mg | ORAL_TABLET | Freq: Every day | ORAL | 0 refills | Status: DC

## 2016-04-26 NOTE — Progress Notes (Signed)
BH MD/PA/NP OP Progress Note  04/26/2016 12:56 PM Morgan Miller  MRN:  [id number]  Chief Complaint:  Chief Complaint    Follow-up     Subjective:  Depression . Follow up  HPI: History of Present Illness:  Patient is a 36 years old [place] American descent married female referred initially  by primary care physician for anxiety depression and possible PTSD. Also been diagnosed with ADHD.  Patient returns for follow-up she is doing reasonable 23rd week pregnancy following closely with her OB/GYN doctor She still takes Adderall one third 3 times a day to keep her concentration. She is working and is looking for days off once she has the baby She is not worried about other stress and legal issues related with her job and incident that happened a year ago   She is trying to focus on pregnancy Aggravating factors; job . Modifying factor her husband     Visit Diagnosis:    ICD-9-CM ICD-10-CM   1. Moderate episode of recurrent major depressive disorder (HCC) 296.32 F33.1   2. PTSD (post-traumatic stress disorder) 309.81 F43.10   3. Panic disorder 300.01 F41.0   4. 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 Complication of anesthesia    epidural caused syncope  . Hypertension   . Preeclampsia in postpartum period     Past Surgical History:  Procedure Laterality Date  . CESAREAN SECTION       Family History:  Family History  Problem Relation Age of Onset  . Hyperlipidemia Mother   . Depression Mother   . Anxiety disorder Mother   . Diabetes Father   . Hyperlipidemia Father   . Hypertension Father   . Parkinson's disease Father   . Alcohol abuse Paternal Uncle   . Heart attack Paternal Grandfather     Social History:  Social History   Social History  . Marital status: Married    Spouse name: N/A  . Number of  children: N/A  . Years of education: N/A   Occupational History  . supervisor    Social History Main Topics  . Smoking status: Former Smoker    Packs/day: 0.30    Types: Cigarettes  . Smokeless tobacco: Never Used  . Alcohol use No     Comment: Every couple of weeks  . Drug use: No  . Sexual activity: Yes    Partners: Male   Other Topics Concern  . None   Social History Narrative  . None    Allergies:  Allergies  Allergen Reactions  . Codeine Nausea Only  . Nifedipine     fatigue  . Penicillins   . Sulfamethoxazole Nausea And Vomiting    Metabolic Disorder Labs: Lab Results  Component Value Date   HGBA1C 5.7 (H) 07/14/2015   MPG 117 07/14/2015   MPG 126 (H) 07/25/2014   No results found for: PROLACTIN Lab Results  Component Value Date   CHOL 193 06/25/2013   TRIG 122 06/25/2013   HDL 48 06/25/2013   CHOLHDL 4.0 06/25/2013   VLDL 24 06/25/2013   LDLCALC 121 (H) 06/25/2013     Current Medications: Current Outpatient Prescriptions  Medication Sig Dispense Refill  . ALPRAZolam (XANAX) 0.5 MG tablet TAKE 1 TABLET BY MOUTH 2 TIMES DAILY AS NEEDED (Patient not taking: Reported on 02/20/2016) 60 tablet 5  . amphetamine-dextroamphetamine (ADDERALL) 15 MG tablet  Take 0.5 tablets by mouth daily. Take half to one divided dose a day 30 tablet 0  . citalopram (CELEXA) 10 MG tablet Take 1.5 tablets (15 mg total) by mouth daily. (Patient not taking: Reported on 02/20/2016) 45 tablet 1  . citalopram (CELEXA) 20 MG tablet Take 1 tablet (20 mg total) by mouth daily. (Patient not taking: Reported on 02/20/2016) 30 tablet 1  . DiphenhydrAMINE HCl, Sleep, (UNISOM SLEEPGELS) 50 MG CAPS Take one tablet at bedtime. (Patient not taking: Reported on 02/20/2016) 30 each 11  . Doxylamine-Pyridoxine 10-10 MG TBEC Take 10 mg by mouth.    . Multiple Vitamin (MULTIVITAMIN) capsule Take by mouth.    . progesterone (PROMETRIUM) 100 MG capsule Place 1 capsule vaginally at bedtime.     No  current facility-administered medications for this visit.       Psychiatric Specialty Exam: Review of Systems  Constitutional: Negative for fever.  Cardiovascular: Negative for palpitations.  Gastrointestinal: Negative for nausea.  Skin: Negative for rash.  Psychiatric/Behavioral: Negative for depression and suicidal ideas.    Blood pressure 128/85, pulse 78, height 5\' 7"  (1.702 m), weight 176 lb (79.8 kg), last menstrual period 11/22/2015.Body mass index is 27.57 kg/m.  General Appearance: Casual  Eye Contact:  Fair  Speech:  Normal Rate  Volume:  Decreased  Mood: euthymic  Affect: reactive  Thought Process:  Goal Directed  Orientation:  Full (Time, Place, and Person)  Thought Content: clear  Suicidal Thoughts:  No  Homicidal Thoughts:  No  Memory:  Immediate;   Fair Recent;   Good  Judgement:  Fair  Insight:  Fair  Psychomotor Activity:  Normal  Concentration:  Concentration: Fair and Attention Span: Fair  Recall:  [employer]Fair  Fund of Knowledge: Fair  Language: Fair  Akathisia:  Negative  Handed:  Right  AIMS (if indicated):    Assets:  Husband support  ADL's:  Intact  Cognition: WNL  Sleep:  Fair to variable     Treatment Plan Summary:Medication management and Plan as follows   1. ADHD: continue low dose adderall no change in dose 2. PTSD: not worsened 3. Depression: fair as of now    No reported side effects Continue therapy and low stress job for now [employer]  when she need.  FU in 8 weeks or earlier if needed Thresa RossAKHTAR, Asyia Hornung, MD 04/26/2016, 12:56 PM

## 2016-05-01 ENCOUNTER — Emergency Department
Admission: EM | Admit: 2016-05-01 | Discharge: 2016-05-01 | Disposition: A | Discharge status: Home / Self Care | Payer: Federal, State, Local not specified - PPO | Source: Home / Self Care | Attending: Family Medicine | Admitting: Family Medicine

## 2016-05-01 ENCOUNTER — Encounter: Payer: Self-pay | Admitting: Emergency Medicine

## 2016-05-01 DIAGNOSIS — R69 Illness, unspecified: Secondary | ICD-10-CM

## 2016-05-01 DIAGNOSIS — J069 Acute upper respiratory infection, unspecified: Secondary | ICD-10-CM | POA: Diagnosis not present

## 2016-05-01 DIAGNOSIS — J111 Influenza due to unidentified influenza virus with other respiratory manifestations: Secondary | ICD-10-CM

## 2016-05-01 LAB — POCT INFLUENZA A/B
INFLUENZA A, POC: NEGATIVE
INFLUENZA B, POC: NEGATIVE

## 2016-05-01 MED ORDER — OSELTAMIVIR PHOSPHATE 75 MG PO CAPS: 75.0000 mg | ORAL_CAPSULE | Freq: Two times a day (BID) | ORAL | 0 refills | Status: DC

## 2016-05-01 NOTE — Discharge Instructions (Signed)
Take plain guaifenesin (1200mg extended release tabs such as Mucinex) twice daily, with plenty of water, for cough and congestion.  Get adequate rest.   °Also recommend using saline nasal spray several times daily and saline nasal irrigation (AYR is a common brand).    °Try warm salt water gargles for sore throat.  °Stop all antihistamines for now, and other non-prescription cough/cold preparations. °May take Tylenol as needed for fever, headache, sore throat, etc.  °  °  °

## 2016-05-01 NOTE — ED Triage Notes (Signed)
Cough, chills, body aches, fever, fatigue since yesterday

## 2016-05-01 NOTE — ED Provider Notes (Signed)
[facility]    CSN: [id number] Arrival date & time: 05/01/16  1046     History   Chief Complaint Chief Complaint  Patient presents with  . Influenza    HPI Morgan Miller is a 36 y.o. female.   Complains of 2 day history flu-like illness including myalgias, headache, fever/chills, fatigue, and cough.  Also has mild nasal congestion but no sore throat.  Cough is non-productive.  No pleuritic pain or shortness of breath.  She is presently pregnant at 5 months, 23 weeks.   The history is provided by the patient.    Past Medical History:  Diagnosis Date  . Complication of anesthesia    epidural caused syncope  . Hypertension   . Preeclampsia in postpartum period     Patient Active Problem List   Diagnosis Date Noted  . Adjustment disorder with mixed anxiety and depressed mood 11/24/2015  . Hair loss 07/14/2015  . No energy 07/14/2015  . Vaginitis and vulvovaginitis 04/27/2015  . Cervical pain (neck) 04/27/2015  . PTSD (post-traumatic stress disorder) 04/12/2015  . Panic disorder 04/12/2015  . Attention deficit hyperactivity disorder (ADHD), predominantly inattentive type 04/12/2015  . Moderate episode of recurrent major depressive disorder (HCC) 04/12/2015  . Insomnia 03/07/2015  . Emotional crisis, acute reaction to stress 03/07/2015  . Overweight (BMI 25.0-29.9) 08/10/2014  . Anxiety in acute stress reaction 08/10/2014  . Miscarriage 08/09/2014  . Abnormal pregnancy in first trimester 07/26/2014  . Pre-diabetes 06/29/2013  . Essential hypertension-postpartum 06/21/2013  . Abnormal weight gain 06/21/2013  . BMI 29.0-29.9,adult 06/21/2013  . Endometriosis 08/30/2009    Past Surgical History:  Procedure Laterality Date  . CESAREAN SECTION      OB History    Gravida Para Term Preterm AB Living   5 2 2   1 2    SAB TAB Ectopic Multiple Live Births   1       2       Home Medications    Prior to Admission medications   Medication Sig Start Date  End Date Taking? Authorizing Provider  acetaminophen (TYLENOL) 325 MG tablet Take 650 mg by mouth every 6 (six) hours as needed.   Yes Historical Provider, MD  amphetamine-dextroamphetamine (ADDERALL) 15 MG tablet Take 0.5 tablets by mouth daily. Take half to one divided dose a day 04/26/16 04/26/17  Thresa Ross, MD  Multiple Vitamin (MULTIVITAMIN) capsule Take by mouth.    Historical Provider, MD  oseltamivir (TAMIFLU) 75 MG capsule Take 1 capsule (75 mg total) by mouth every 12 (twelve) hours. 05/01/16   Lattie Haw, MD  progesterone (PROMETRIUM) 100 MG capsule Place 1 capsule vaginally at bedtime. 01/04/16   Historical Provider, MD    Family History Family History  Problem Relation Age of Onset  . Hyperlipidemia Mother   . Depression Mother   . Anxiety disorder Mother   . Diabetes Father   . Hyperlipidemia Father   . Hypertension Father   . Parkinson's disease Father   . Alcohol abuse Paternal Uncle   . Heart attack Paternal Grandfather     Social History Social History  Substance Use Topics  . Smoking status: Former Smoker    Packs/day: 0.30    Types: Cigarettes  . Smokeless tobacco: Never Used  . Alcohol use No     Comment: Every couple of weeks     Allergies   Codeine; Nifedipine; Penicillins; and Sulfamethoxazole   Review of Systems Review of Systems No sore throat + cough  No pleuritic pain No wheezing + nasal congestion + post-nasal drainage No sinus pain/pressure No itchy/red eyes No earache No hemoptysis No SOB + fever, + chills No nausea No vomiting No abdominal pain No diarrhea No urinary symptoms No skin rash + fatigue + myalgias + arthralgias No headache Used OTC meds without relief   Physical Exam Triage Vital Signs ED Triage Vitals  Enc Vitals Group     BP 05/01/16 1124 99/67     Pulse Rate 05/01/16 1124 117     Resp --      Temp 05/01/16 1124 98.6 F (37 C)     Temp Source 05/01/16 1124 Oral     SpO2 05/01/16 1124 99 %      Weight 05/01/16 1124 182 lb (82.6 kg)     Height 05/01/16 1124 5\' 7"  (1.702 m)     Head Circumference --      Peak Flow --      Pain Score 05/01/16 1127 3     Pain Loc --      Pain Edu? --      Excl. in GC? --    No data found.   Updated Vital Signs BP 99/67 (BP Location: Left Arm)   Pulse 117   Temp 98.6 F (37 C) (Oral)   Ht 5\' 7"  (1.702 m)   Wt 182 lb (82.6 kg)   LMP 11/22/2015 Comment: 23weeks  SpO2 99%   BMI 28.51 kg/m   Visual Acuity Right Eye Distance:   Left Eye Distance:   Bilateral Distance:    Right Eye Near:   Left Eye Near:    Bilateral Near:     Physical Exam Nursing notes and Vital Signs reviewed. Appearance:  Patient appears stated age, and in no acute distress Eyes:  Pupils are equal, round, and reactive to light and accomodation.  Extraocular movement is intact.  Conjunctivae are not inflamed  Ears:  Canals normal.  Tympanic membranes normal.  Nose:  Mildly congested turbinates.  No sinus tenderness.    Pharynx:  Normal Neck:  Supple.  Tender enlarged posterior/lateral nodes are palpated bilaterally  Lungs:  Clear to auscultation.  Breath sounds are equal.  Moving air well. Heart:  Regular rate and rhythm without murmurs, rubs, or gallops.  Abdomen:  Gravid, nontender without masses or hepatosplenomegaly.  Bowel sounds are present.  No CVA or flank tenderness.  Extremities:  No edema.  Skin:  No rash present.    UC Treatments / Results  Labs (all labs ordered are listed, but only abnormal results are displayed) Labs Reviewed  POCT INFLUENZA A/B    EKG  EKG Interpretation None       Radiology No results found.  Procedures Procedures (including critical care time)  Medications Ordered in UC Medications - No data to display   Initial Impression / Assessment and Plan / UC Course  I have reviewed the triage vital signs and the nursing notes.  Pertinent labs & imaging results that were available during my care of the patient were  reviewed by me and considered in my medical decision making (see chart for details).    Despite negative flu test, symptoms are distinctly flu-like. Begin Tamiflu. Take plain guaifenesin (1200mg  extended release tabs such as Mucinex) twice daily, with plenty of water, for cough and congestion. Get adequate rest.   Also recommend using saline nasal spray several times daily and saline nasal irrigation (AYR is a common brand).  Try warm salt water gargles for  sore throat.  Stop all antihistamines for now, and other non-prescription cough/cold preparations. May take Tylenol as needed for fever, headache, sore throat, etc.  Followup with Family Doctor if not improved in 5 days.    Final Clinical Impressions(s) / UC Diagnoses   Final diagnoses:  Upper respiratory tract infection, unspecified type  Influenza-like illness    New Prescriptions New Prescriptions   OSELTAMIVIR (TAMIFLU) 75 MG CAPSULE    Take 1 capsule (75 mg total) by mouth every 12 (twelve) hours.     Lattie HawStephen A Samaad Hashem, MD 05/01/16 [phone]

## 2016-05-03 ENCOUNTER — Telehealth (HOSPITAL_COMMUNITY): Payer: Self-pay | Admitting: *Deleted

## 2016-05-03 NOTE — Telephone Encounter (Signed)
Prior authorization for Adderall received. Called [facility] spoke with Cece who approved from 03/04/16- 05/03/17. WU#[id number]. Pharmacy notified.

## 2016-05-03 NOTE — Telephone Encounter (Signed)
Prior authorization for Adderall received. Submitted online with cover my meds, and was given an approval. Called to notify pharmacy.

## 2016-05-09 ENCOUNTER — Telehealth: Payer: Self-pay | Admitting: *Deleted

## 2016-05-09 NOTE — Telephone Encounter (Signed)
Patient called requesting a work note specifying her reason for visit. This was requested by her employer. Note was printed per her request and left @ front desk.

## 2016-05-10 ENCOUNTER — Ambulatory Visit (HOSPITAL_COMMUNITY): Payer: Self-pay | Admitting: Psychiatry

## 2016-05-24 ENCOUNTER — Other Ambulatory Visit (HOSPITAL_COMMUNITY): Payer: Self-pay | Admitting: Psychiatry

## 2016-05-24 ENCOUNTER — Telehealth (HOSPITAL_COMMUNITY): Payer: Self-pay | Admitting: *Deleted

## 2016-05-24 ENCOUNTER — Ambulatory Visit (HOSPITAL_COMMUNITY): Payer: Self-pay | Admitting: Psychiatry

## 2016-05-24 DIAGNOSIS — Z6829 Body mass index (BMI) 29.0-29.9, adult: Secondary | ICD-10-CM | POA: Diagnosis not present

## 2016-05-24 DIAGNOSIS — Z3482 Encounter for supervision of other normal pregnancy, second trimester: Secondary | ICD-10-CM | POA: Diagnosis not present

## 2016-05-24 MED ORDER — AMPHETAMINE-DEXTROAMPHETAMINE 15 MG PO TABS: 7.5000 mg | ORAL_TABLET | Freq: Every day | ORAL | 0 refills | Status: DC

## 2016-05-24 NOTE — Telephone Encounter (Signed)
Pt had apt schedule to see Dr. Gilmore LarocheAkhtar, late cancellation due to family situation. Pt request a refill for Adderall. Pt states she will be out medication on 05/27/16.  Per Dr. Gilmore LarocheAkhtar, Adderall Rx is ready for pickup. Pt next apt is schedule on 05/31/16. Nothing further is needed at this time.

## 2016-05-24 NOTE — Progress Notes (Signed)
adderall printed for pick up 

## 2016-05-30 DIAGNOSIS — Z3482 Encounter for supervision of other normal pregnancy, second trimester: Secondary | ICD-10-CM | POA: Diagnosis not present

## 2016-05-30 DIAGNOSIS — Z6791 Unspecified blood type, Rh negative: Secondary | ICD-10-CM | POA: Diagnosis not present

## 2016-05-30 DIAGNOSIS — Z3483 Encounter for supervision of other normal pregnancy, third trimester: Secondary | ICD-10-CM | POA: Diagnosis not present

## 2016-05-31 ENCOUNTER — Ambulatory Visit (INDEPENDENT_AMBULATORY_CARE_PROVIDER_SITE_OTHER): Payer: Federal, State, Local not specified - PPO | Admitting: Psychiatry

## 2016-05-31 DIAGNOSIS — Z8249 Family history of ischemic heart disease and other diseases of the circulatory system: Secondary | ICD-10-CM

## 2016-05-31 DIAGNOSIS — F331 Major depressive disorder, recurrent, moderate: Secondary | ICD-10-CM | POA: Diagnosis not present

## 2016-05-31 DIAGNOSIS — Z882 Allergy status to sulfonamides status: Secondary | ICD-10-CM

## 2016-05-31 DIAGNOSIS — Z833 Family history of diabetes mellitus: Secondary | ICD-10-CM

## 2016-05-31 DIAGNOSIS — Z8349 Family history of other endocrine, nutritional and metabolic diseases: Secondary | ICD-10-CM | POA: Diagnosis not present

## 2016-05-31 DIAGNOSIS — Z811 Family history of alcohol abuse and dependence: Secondary | ICD-10-CM | POA: Diagnosis not present

## 2016-05-31 DIAGNOSIS — Z818 Family history of other mental and behavioral disorders: Secondary | ICD-10-CM | POA: Diagnosis not present

## 2016-05-31 DIAGNOSIS — Z9889 Other specified postprocedural states: Secondary | ICD-10-CM

## 2016-05-31 DIAGNOSIS — F410 Panic disorder [episodic paroxysmal anxiety] without agoraphobia: Secondary | ICD-10-CM | POA: Diagnosis not present

## 2016-05-31 DIAGNOSIS — Z888 Allergy status to other drugs, medicaments and biological substances status: Secondary | ICD-10-CM

## 2016-05-31 DIAGNOSIS — Z79899 Other long term (current) drug therapy: Secondary | ICD-10-CM

## 2016-05-31 DIAGNOSIS — F900 Attention-deficit hyperactivity disorder, predominantly inattentive type: Secondary | ICD-10-CM

## 2016-05-31 DIAGNOSIS — F4310 Post-traumatic stress disorder, unspecified: Secondary | ICD-10-CM

## 2016-05-31 DIAGNOSIS — Z87891 Personal history of nicotine dependence: Secondary | ICD-10-CM

## 2016-05-31 DIAGNOSIS — Z880 Allergy status to penicillin: Secondary | ICD-10-CM

## 2016-05-31 NOTE — Progress Notes (Signed)
BH MD/PA/NP OP Progress Note  05/31/2016 12:30 PM Morgan Miller  MRN:  [id number]  Chief Complaint:   Subjective:  Depression . Follow up  HPI: History of Present Illness:  Patient is a 36 years old [place] descent married female referred initially  by primary care physician for anxiety depression and possible PTSD. Also been diagnosed with ADHD.  Patient is [redacted] week pregnant.  Going through job stress she had fluent one to take some days off but was not approved. She stressed because she is going to take maternity leave and is worried that she may not get the maternity leave so she is trying to lawyer. Upset about the recent stress of work were not getting approved for days off when  she needs it Still does not want to go back on Celexa she is on 3rdtrimesters , explained that it would be safer now rather than when she was in her first trimester. We also advised to follow with OB/GYN case she gets interested back to get on Celexa. She is feeling more distracted at work and stress has taken 15 mg in divided doses as recommended earlier more so on a regular basis now   Aggravating factors; job . Modifying factor her husband No psychosis     Visit Diagnosis:    ICD-9-CM ICD-10-CM   1. Moderate episode of recurrent major depressive disorder (HCC) 296.32 F33.1   2. PTSD (post-traumatic stress disorder) 309.81 F43.10   3. Panic disorder 300.01 F41.0   4. 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 Complication of anesthesia    epidural caused syncope  . Hypertension   . Preeclampsia in postpartum period     Past Surgical History:  Procedure Laterality Date  . CESAREAN SECTION       Family History:  Family History  Problem Relation Age of Onset  . Hyperlipidemia Mother   . Depression Mother   . Anxiety disorder  Mother   . Diabetes Father   . Hyperlipidemia Father   . Hypertension Father   . Parkinson's disease Father   . Alcohol abuse Paternal Uncle   . Heart attack Paternal Grandfather     Social History:  Social History   Social History  . Marital status: Married    Spouse name: N/A  . Number of children: N/A  . Years of education: N/A   Occupational History  . supervisor    Social History Main Topics  . Smoking status: Former Smoker    Packs/day: 0.30    Types: Cigarettes  . Smokeless tobacco: Never Used  . Alcohol use No     Comment: Every couple of weeks  . Drug use: No  . Sexual activity: Yes    Partners: Male   Other Topics Concern  . Not on file   Social History Narrative  . No narrative on file    Allergies:  Allergies  Allergen Reactions  . Codeine Nausea Only  . Nifedipine     fatigue  . Penicillins   . Sulfamethoxazole Nausea And Vomiting    Metabolic Disorder Labs: Lab Results  Component Value Date   HGBA1C 5.7 (H) 07/14/2015   MPG 117 07/14/2015   MPG 126 (H) 07/25/2014   No results found for: PROLACTIN Lab Results  Component Value Date   CHOL 193 06/25/2013   TRIG 122 06/25/2013  HDL 48 06/25/2013   CHOLHDL 4.0 06/25/2013   VLDL 24 06/25/2013   LDLCALC 121 (H) 06/25/2013     Current Medications: Current Outpatient Prescriptions  Medication Sig Dispense Refill  . acetaminophen (TYLENOL) 325 MG tablet Take 650 mg by mouth every 6 (six) hours as needed.    Marland Kitchen. amphetamine-dextroamphetamine (ADDERALL) 15 MG tablet Take 0.5 tablets by mouth daily. Take half to one divided dose a day 30 tablet 0  . Multiple Vitamin (MULTIVITAMIN) capsule Take by mouth.    . oseltamivir (TAMIFLU) 75 MG capsule Take 1 capsule (75 mg total) by mouth every 12 (twelve) hours. 10 capsule 0  . progesterone (PROMETRIUM) 100 MG capsule Place 1 capsule vaginally at bedtime.     No current facility-administered medications for this visit.       Psychiatric  Specialty Exam: Review of Systems  Constitutional: Negative for fever.  Cardiovascular: Negative for chest pain.  Gastrointestinal: Negative for nausea.  Skin: Negative for rash.  Psychiatric/Behavioral: Positive for depression. Negative for suicidal ideas.    Last menstrual period 11/22/2015.There is no height or weight on file to calculate BMI.  General Appearance: Casual  Eye Contact:  Fair  Speech:  Normal Rate  Volume:  Decreased  Mood: somewhat dysphoric due to stress  Affect: congruent  Thought Process:  Goal Directed  Orientation:  Full (Time, Place, and Person)  Thought Content: clear  Suicidal Thoughts:  No  Homicidal Thoughts:  No  Memory:  Immediate;   Fair Recent;   Good  Judgement:  Fair  Insight:  Fair  Psychomotor Activity:  Normal  Concentration:  Concentration: Fair and Attention Span: Fair  Recall:  [employer]  Fund of Knowledge: Fair  Language: Fair  Akathisia:  Negative  Handed:  Right  AIMS (if indicated):    Assets:  Husband support  ADL's:  Intact  Cognition: WNL  Sleep:  Fair to variable     Treatment Plan Summary:Medication management and Plan as follows   1. ADHD: somewhat worsened due to added stress. Advised to take divided doses uptil 15mg  per day.  2. PTSD: fluctuates. Not wanting to be back on celexa for now 3. Depression: anxiety has been worse due to job stress. Says she will try to manage without celexa. explaiend its category c and safer in 3rd trimester. She will think if want to get started back.   Continue therapy and low stress job for now [employer]  when she need.  FU in 4-6 weeks or early if needed. FU with obgyn and call back if wants to get back on celexa and keep her informed.  Thresa RossAKHTAR, Morgan Rudden, MD 05/31/2016, 12:30 PM

## 2016-06-18 ENCOUNTER — Encounter (HOSPITAL_COMMUNITY): Payer: Self-pay | Admitting: Psychiatry

## 2016-06-21 ENCOUNTER — Ambulatory Visit (HOSPITAL_COMMUNITY): Payer: Self-pay | Admitting: Psychiatry

## 2016-06-26 ENCOUNTER — Ambulatory Visit (HOSPITAL_COMMUNITY): Payer: Self-pay | Admitting: Psychiatry

## 2016-06-26 ENCOUNTER — Telehealth (HOSPITAL_COMMUNITY): Payer: Self-pay | Admitting: Psychiatry

## 2016-06-26 MED ORDER — AMPHETAMINE-DEXTROAMPHETAMINE 15 MG PO TABS: 7.5000 mg | ORAL_TABLET | Freq: Every day | ORAL | 0 refills | Status: DC

## 2016-06-26 NOTE — Telephone Encounter (Signed)
Pt needs her adderall rx wrote for her.  Husband will be by to pick it up later today.

## 2016-06-26 NOTE — Telephone Encounter (Signed)
adderall printed for pick up 

## 2016-06-27 DIAGNOSIS — O139 Gestational [pregnancy-induced] hypertension without significant proteinuria, unspecified trimester: Secondary | ICD-10-CM | POA: Diagnosis not present

## 2016-07-03 DIAGNOSIS — O139 Gestational [pregnancy-induced] hypertension without significant proteinuria, unspecified trimester: Secondary | ICD-10-CM | POA: Diagnosis not present

## 2016-07-19 ENCOUNTER — Encounter (HOSPITAL_COMMUNITY): Payer: Self-pay | Admitting: Psychiatry

## 2016-07-19 ENCOUNTER — Ambulatory Visit (INDEPENDENT_AMBULATORY_CARE_PROVIDER_SITE_OTHER): Payer: Federal, State, Local not specified - PPO | Admitting: Psychiatry

## 2016-07-19 VITALS — BP 124/68 | HR 91 | Resp 16 | Ht 67.0 in | Wt 195.0 lb

## 2016-07-19 DIAGNOSIS — Z811 Family history of alcohol abuse and dependence: Secondary | ICD-10-CM

## 2016-07-19 DIAGNOSIS — F410 Panic disorder [episodic paroxysmal anxiety] without agoraphobia: Secondary | ICD-10-CM

## 2016-07-19 DIAGNOSIS — Z818 Family history of other mental and behavioral disorders: Secondary | ICD-10-CM

## 2016-07-19 DIAGNOSIS — Z79899 Other long term (current) drug therapy: Secondary | ICD-10-CM | POA: Diagnosis not present

## 2016-07-19 DIAGNOSIS — Z87891 Personal history of nicotine dependence: Secondary | ICD-10-CM | POA: Diagnosis not present

## 2016-07-19 DIAGNOSIS — F900 Attention-deficit hyperactivity disorder, predominantly inattentive type: Secondary | ICD-10-CM

## 2016-07-19 DIAGNOSIS — F4310 Post-traumatic stress disorder, unspecified: Secondary | ICD-10-CM

## 2016-07-19 DIAGNOSIS — F331 Major depressive disorder, recurrent, moderate: Secondary | ICD-10-CM | POA: Diagnosis not present

## 2016-07-19 MED ORDER — AMPHETAMINE-DEXTROAMPHETAMINE 15 MG PO TABS: 7.5000 mg | ORAL_TABLET | Freq: Every day | ORAL | 0 refills | Status: DC

## 2016-07-19 NOTE — Progress Notes (Signed)
BH MD/PA/NP OP Progress Note  07/19/2016 9:52 AM Morgan Miller  MRN:  [id number]  Chief Complaint:  Chief Complaint    Follow-up     Subjective:  Depression . Follow up  HPI: History of Present Illness:  Patient is a 36 years old [place] descent married female referred initially  by primary care physician for anxiety depression and possible PTSD. Also been diagnosed with ADHD.  Patient is [redacted] weeks pregnant  Last visit she was going through job stress and was having some difficulty if she would get the maternity leave. She is now finding on maternity leave for 6 months much relief stress is relieved denies depression. Her case related to her job at the [employer] also has move forward with the lawyer. Anxiety is not worsened. Last visit we were discussing to start citalopram but apparently they did not and she is doing reasonable without the medication relief that she is at home and can focus on the pregnancy husband is supportive.  She still gets distracted at home and wants to continue a small dose of Adderall she understands the risk and OB/GYN is aware of her dosage    Aggravating factors; prior job stress. Pregnancy (positive stress) Modifying factor : her husband.  No psychosis     Visit Diagnosis:    ICD-9-CM ICD-10-CM   1. Moderate episode of recurrent major depressive disorder (HCC) 296.32 F33.1   2. PTSD (post-traumatic stress disorder) 309.81 F43.10   3. Panic disorder 300.01 F41.0   4. 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 Complication of anesthesia    epidural caused syncope  . Hypertension   . Preeclampsia in postpartum period     Past Surgical History:  Procedure Laterality Date  . CESAREAN SECTION       Family History:  Family History  Problem Relation Age of Onset  .  Hyperlipidemia Mother   . Depression Mother   . Anxiety disorder Mother   . Diabetes Father   . Hyperlipidemia Father   . Hypertension Father   . Parkinson's disease Father   . Alcohol abuse Paternal Uncle   . Heart attack Paternal Grandfather     Social History:  Social History   Social History  . Marital status: Married    Spouse name: N/A  . Number of children: N/A  . Years of education: N/A   Occupational History  . supervisor    Social History Main Topics  . Smoking status: Former Smoker    Packs/day: 0.30    Types: Cigarettes  . Smokeless tobacco: Never Used  . Alcohol use No     Comment: Every couple of weeks  . Drug use: No  . Sexual activity: Yes    Partners: Male   Other Topics Concern  . None   Social History Narrative  . None    Allergies:  Allergies  Allergen Reactions  . Codeine Nausea Only  . Nifedipine     fatigue  . Penicillins   . Sulfamethoxazole Nausea And Vomiting    Metabolic Disorder Labs: Lab Results  Component Value Date   HGBA1C 5.7 (H) 07/14/2015   MPG 117 07/14/2015   MPG 126 (H) 07/25/2014   No results found for: PROLACTIN Lab Results  Component Value Date   CHOL 193 06/25/2013   TRIG 122 06/25/2013   HDL 48 06/25/2013  CHOLHDL 4.0 06/25/2013   VLDL 24 06/25/2013   LDLCALC 121 (H) 06/25/2013     Current Medications: Current Outpatient Prescriptions  Medication Sig Dispense Refill  . acetaminophen (TYLENOL) 325 MG tablet Take 650 mg by mouth every 6 (six) hours as needed.    Marland Kitchen amphetamine-dextroamphetamine (ADDERALL) 15 MG tablet Take 0.5 tablets by mouth daily. Take half to one divided dose a day 30 tablet 0  . Multiple Vitamin (MULTIVITAMIN) capsule Take by mouth.    . oseltamivir (TAMIFLU) 75 MG capsule Take 1 capsule (75 mg total) by mouth every 12 (twelve) hours. 10 capsule 0  . progesterone (PROMETRIUM) 100 MG capsule Place 1 capsule vaginally at bedtime.     No current facility-administered medications  for this visit.       Psychiatric Specialty Exam: Review of Systems  Constitutional: Negative for fever.  Cardiovascular: Negative for palpitations.  Gastrointestinal: Negative for nausea.  Skin: Negative for rash.  Psychiatric/Behavioral: Negative for suicidal ideas.    Blood pressure 124/68, pulse 91, resp. rate 16, height  (1.702 m), weight 195 lb (88.5 kg), last menstrual period 11/22/2015, SpO2 97 %.Body mass index is 30.54 kg/m.  General Appearance: Casual  Eye Contact:  Fair  Speech:  Normal Rate  Volume:  Decreased  Mood: euthymic  Affect: congruent and pleasant  Thought Process:  Goal Directed  Orientation:  Full (Time, Place, and Person)  Thought Content: clear  Suicidal Thoughts:  No  Homicidal Thoughts:  No  Memory:  Immediate;   Fair Recent;   Good  Judgement:  Fair  Insight:  Fair  Psychomotor Activity:  Normal  Concentration:  Concentration: Fair and Attention Span: Fair  Recall:  [employer] of Knowledge: Fair  Language: Fair  Akathisia:  Negative  Handed:  Right  AIMS (if indicated):    Assets:  Husband support  ADL's:  Intact  Cognition: WNL  Sleep:  Fair to variable     Treatment Plan Summary:Medication management and Plan as follows   1. ADHD: varies . Continue small dose of adderall as explained above  BP stable 2. PTSD: not worsened.  3. Depression: improved since on maternity leave. Less anxious.  Can follow up in 6-8 weeks. Much better since off from work.  Understands if she feels depressed to call but as of now she does not want to start on anti depressants post partum. Husband to observe for symptoms as well.     Thresa Ross, MD 07/19/2016, 9:52 AM

## 2016-07-23 DIAGNOSIS — O3663X1 Maternal care for excessive fetal growth, third trimester, fetus 1: Secondary | ICD-10-CM | POA: Diagnosis not present

## 2016-07-31 DIAGNOSIS — Z3480 Encounter for supervision of other normal pregnancy, unspecified trimester: Secondary | ICD-10-CM | POA: Diagnosis not present

## 2016-07-31 DIAGNOSIS — O163 Unspecified maternal hypertension, third trimester: Secondary | ICD-10-CM | POA: Diagnosis not present

## 2016-07-31 DIAGNOSIS — O26899 Other specified pregnancy related conditions, unspecified trimester: Secondary | ICD-10-CM | POA: Diagnosis not present

## 2016-07-31 DIAGNOSIS — R5383 Other fatigue: Secondary | ICD-10-CM | POA: Diagnosis not present

## 2016-07-31 DIAGNOSIS — Z331 Pregnant state, incidental: Secondary | ICD-10-CM | POA: Diagnosis not present

## 2016-07-31 DIAGNOSIS — Z3A00 Weeks of gestation of pregnancy not specified: Secondary | ICD-10-CM | POA: Diagnosis not present

## 2016-07-31 DIAGNOSIS — R51 Headache: Secondary | ICD-10-CM | POA: Diagnosis not present

## 2016-08-14 ENCOUNTER — Telehealth (HOSPITAL_COMMUNITY): Payer: Self-pay | Admitting: *Deleted

## 2016-08-14 MED ORDER — AMPHETAMINE-DEXTROAMPHETAMINE 15 MG PO TABS: 7.5000 mg | ORAL_TABLET | Freq: Every day | ORAL | 0 refills | Status: DC

## 2016-08-14 NOTE — Telephone Encounter (Signed)
Prescription printed for pick up adderall

## 2016-08-14 NOTE — Telephone Encounter (Signed)
Pt will need a prescription written for Adderall. Last filled on 07/19/16. Pt's husband will pick up Rx after lunch tomorrow.

## 2016-08-22 DIAGNOSIS — O99344 Other mental disorders complicating childbirth: Secondary | ICD-10-CM | POA: Diagnosis not present

## 2016-08-22 DIAGNOSIS — O43893 Other placental disorders, third trimester: Secondary | ICD-10-CM | POA: Diagnosis not present

## 2016-08-22 DIAGNOSIS — O99824 Streptococcus B carrier state complicating childbirth: Secondary | ICD-10-CM | POA: Diagnosis not present

## 2016-08-22 DIAGNOSIS — O9962 Diseases of the digestive system complicating childbirth: Secondary | ICD-10-CM | POA: Diagnosis not present

## 2016-08-22 DIAGNOSIS — Z2882 Immunization not carried out because of caregiver refusal: Secondary | ICD-10-CM | POA: Diagnosis not present

## 2016-08-22 DIAGNOSIS — Z888 Allergy status to other drugs, medicaments and biological substances status: Secondary | ICD-10-CM | POA: Diagnosis not present

## 2016-08-22 DIAGNOSIS — Z051 Observation and evaluation of newborn for suspected infectious condition ruled out: Secondary | ICD-10-CM | POA: Diagnosis not present

## 2016-08-22 DIAGNOSIS — Z3483 Encounter for supervision of other normal pregnancy, third trimester: Secondary | ICD-10-CM | POA: Diagnosis not present

## 2016-08-22 DIAGNOSIS — F419 Anxiety disorder, unspecified: Secondary | ICD-10-CM | POA: Diagnosis not present

## 2016-08-22 DIAGNOSIS — Z87891 Personal history of nicotine dependence: Secondary | ICD-10-CM | POA: Diagnosis not present

## 2016-08-22 DIAGNOSIS — Q542 Hypospadias, penoscrotal: Secondary | ICD-10-CM | POA: Diagnosis not present

## 2016-08-22 DIAGNOSIS — Z3A39 39 weeks gestation of pregnancy: Secondary | ICD-10-CM | POA: Diagnosis not present

## 2016-08-22 DIAGNOSIS — O34219 Maternal care for unspecified type scar from previous cesarean delivery: Secondary | ICD-10-CM | POA: Diagnosis not present

## 2016-08-22 DIAGNOSIS — Z885 Allergy status to narcotic agent status: Secondary | ICD-10-CM | POA: Diagnosis not present

## 2016-08-22 DIAGNOSIS — L7682 Other postprocedural complications of skin and subcutaneous tissue: Secondary | ICD-10-CM | POA: Diagnosis not present

## 2016-08-22 DIAGNOSIS — K660 Peritoneal adhesions (postprocedural) (postinfection): Secondary | ICD-10-CM | POA: Diagnosis not present

## 2016-08-22 DIAGNOSIS — O34211 Maternal care for low transverse scar from previous cesarean delivery: Secondary | ICD-10-CM | POA: Diagnosis not present

## 2016-08-27 DIAGNOSIS — R300 Dysuria: Secondary | ICD-10-CM | POA: Diagnosis not present

## 2016-08-27 DIAGNOSIS — M545 Low back pain: Secondary | ICD-10-CM | POA: Diagnosis not present

## 2016-09-13 ENCOUNTER — Ambulatory Visit: Payer: Self-pay | Admitting: Osteopathic Medicine

## 2016-09-13 ENCOUNTER — Ambulatory Visit (INDEPENDENT_AMBULATORY_CARE_PROVIDER_SITE_OTHER): Payer: Federal, State, Local not specified - PPO | Admitting: Physician Assistant

## 2016-09-13 ENCOUNTER — Encounter: Payer: Self-pay | Admitting: Physician Assistant

## 2016-09-13 ENCOUNTER — Ambulatory Visit (HOSPITAL_COMMUNITY): Payer: Self-pay | Admitting: Psychiatry

## 2016-09-13 VITALS — BP 124/85 | HR 76 | Temp 97.4°F | Wt 182.0 lb

## 2016-09-13 DIAGNOSIS — H6502 Acute serous otitis media, left ear: Secondary | ICD-10-CM | POA: Diagnosis not present

## 2016-09-13 MED ORDER — CEFDINIR 300 MG PO CAPS: 600.0000 mg | ORAL_CAPSULE | Freq: Every day | ORAL | 0 refills | Status: DC

## 2016-09-13 NOTE — Progress Notes (Signed)
HPI:                                                                Morgan Miller is a 36 y.o. [facility] today for left ear pain  Otalgia   There is pain in the left ear. This is a new problem. The current episode started yesterday. The problem occurs constantly. The problem has been gradually worsening. There has been no fever. The pain is severe. Associated symptoms include a sore throat. Pertinent negatives include no ear discharge. Treatments tried: Norco. The treatment provided moderate relief. There is no history of a chronic ear infection.    Past Medical History:  Diagnosis Date  . Complication of anesthesia    epidural caused syncope  . Hypertension   . Preeclampsia in postpartum period    Past Surgical History:  Procedure Laterality Date  . CESAREAN SECTION     Social History  Substance Use Topics  . Smoking status: Former Smoker    Packs/day: 0.30    Types: Cigarettes  . Smokeless tobacco: Never Used  . Alcohol use No     Comment: Every couple of weeks   family history includes Alcohol abuse in her paternal uncle; Anxiety disorder in her mother; Depression in her mother; Diabetes in her father; Heart attack in her paternal grandfather; Hyperlipidemia in her father and mother; Hypertension in her father; Parkinson's disease in her father.  ROS: negative except as noted in the HPI  Medications: Current Outpatient Prescriptions  Medication Sig Dispense Refill  . amphetamine-dextroamphetamine (ADDERALL) 15 MG tablet Take 0.5 tablets by mouth daily. Take half to one divided dose a day 30 tablet 0   No current facility-administered medications for this visit.    Allergies  Allergen Reactions  . Codeine Nausea Only  . Nifedipine     fatigue  . Penicillins   . Sulfamethoxazole Nausea And Vomiting       Objective:  BP 124/85   Pulse 76   Temp 97.4 F (36.3 C) (Oral)   Wt 182 lb (82.6  kg)   LMP 11/22/2015   BMI 28.51 kg/m  Gen: well-groomed, cooperative, not ill-appearing, no distress HEENT: normal conjunctiva, no facial edema, right TM clear, left TM erythematous with effusion, no mastoid tenderness, no tragus tenderness, no auricular tenderness  Pulm: Normal work of breathing, normal phonation, trachea midline Neuro: alert and oriented x 3, EOM's intact, no tremor MSK: moving all extremities, normal gait and station, no peripheral edema Lymph: no pre-auricular or post-auricular adenopathy Skin: warm, dry, intact; no rashes or lesions on exposed skin, no cyanosis   No results found for this or any previous visit (from the past 72 hour(s)). No results found.    Assessment and Plan: 36 y.o. female with   1. Acute serous otitis media of left ear, recurrence not specified - patient is requesting narcotic pain medication. Explained that risks outweigh benefits since patient is breastfeeding. Recommended Tylenol and Ibuprofen for pain. Explained that pain would improve significantly with antibiotic treatment - cefdinir (OMNICEF) 300 MG capsule; Take 2 capsules (600 mg total) by mouth daily.  Dispense: 14 capsule; Refill: 0  Patient education and anticipatory guidance given Patient agrees with treatment plan Follow-up  as needed if symptoms worsen or fail to improve  Levonne Hubert PA-C

## 2016-09-13 NOTE — Patient Instructions (Signed)
- Take Cefdinir 2 tablets daily for 7 days - Ibuprofen 800 mg every 8 hours for pain - Tylenol 1000 mg every 8 hours for pain   Otitis Media, Adult Otitis media occurs when there is inflammation and fluid in the middle ear. Your middle ear is a part of the ear that contains bones for hearing as well as air that helps send sounds to your brain. What are the causes? This condition is caused by a blockage in the eustachian tube. This tube drains fluid from the ear to the back of the nose (nasopharynx). A blockage in this tube can be caused by an object or by swelling (edema) in the tube. Problems that can cause a blockage include:  A cold or other upper respiratory infection.  Allergies.  An irritant, such as tobacco smoke.  Enlarged adenoids. The adenoids are areas of soft tissue located high in the back of the throat, behind the nose and the roof of the mouth.  A mass in the nasopharynx.  Damage to the ear caused by pressure changes (barotrauma).  What are the signs or symptoms? Symptoms of this condition include:  Ear pain.  A fever.  Decreased hearing.  A headache.  Tiredness (lethargy).  Fluid leaking from the ear.  Ringing in the ear.  How is this diagnosed? This condition is diagnosed with a physical exam. During the exam your health care provider will use an instrument called an otoscope to look into your ear and check for redness, swelling, and fluid. He or she will also ask about your symptoms. Your health care provider may also order tests, such as:  A test to check the movement of the eardrum (pneumatic otoscopy). This test is done by squeezing a small amount of air into the ear.  A test that changes air pressure in the middle ear to check how well the eardrum moves and whether the eustachian tube is working (tympanogram).  How is this treated? This condition usually goes away on its own within 3-5 days. But if the condition is caused by a bacteria infection  and does not go away own its own, or keeps coming back, your health care provider may:  Prescribe antibiotic medicines to treat the infection.  Prescribe or recommend medicines to control pain.  Follow these instructions at home:  Take over-the-counter and prescription medicines only as told by your health care provider.  If you were prescribed an antibiotic medicine, take it as told by your health care provider. Do not stop taking the antibiotic even if you start to feel better.  Keep all follow-up visits as told by your health care provider. This is important. Contact a health care provider if:  You have bleeding from your nose.  There is a lump on your neck.  You are not getting better in 5 days.  You feel worse instead of better. Get help right away if:  You have severe pain that is not controlled with medicine.  You have swelling, redness, or pain around your ear.  You have stiffness in your neck.  A part of your face is paralyzed.  The bone behind your ear (mastoid) is tender when you touch it.  You develop a severe headache. Summary  Otitis media is redness, soreness, and swelling of the middle ear.  This condition usually goes away on its own within 3-5 days.  If the problem does not go away in 3-5 days, your health care provider may prescribe or recommend medicines to  treat your symptoms.  If you were prescribed an antibiotic medicine, take it as told by your health care provider. This information is not intended to replace advice given to you by your health care provider. Make sure you discuss any questions you have with your health care provider. Document Released: 12/29/2003 Document Revised: 03/15/2016 Document Reviewed: 03/15/2016 Elsevier Interactive Patient Education  2017 [employer]

## 2016-09-16 ENCOUNTER — Telehealth: Payer: Self-pay

## 2016-09-16 NOTE — Telephone Encounter (Signed)
Pt was seen on 09/13/16 for ear pain. She was Rx cifdinir.  She had not finished the antibiotic.  She reports that she still feels fluids behind her ear.  She is asking for a different medication.  Denies dizziness, fever, and chills. Please advise. -EH/RMA

## 2016-09-16 NOTE — Telephone Encounter (Signed)
That really requires an office visit.  Please schedule a follow up visit.

## 2016-09-17 NOTE — Telephone Encounter (Signed)
Left recommendations on vm -EH/RMA  

## 2016-09-18 ENCOUNTER — Encounter (HOSPITAL_COMMUNITY): Payer: Self-pay | Admitting: Psychiatry

## 2016-09-18 ENCOUNTER — Ambulatory Visit (INDEPENDENT_AMBULATORY_CARE_PROVIDER_SITE_OTHER): Payer: Federal, State, Local not specified - PPO | Admitting: Psychiatry

## 2016-09-18 ENCOUNTER — Ambulatory Visit (INDEPENDENT_AMBULATORY_CARE_PROVIDER_SITE_OTHER): Payer: Federal, State, Local not specified - PPO | Admitting: Sports Medicine

## 2016-09-18 ENCOUNTER — Encounter: Payer: Self-pay | Admitting: Sports Medicine

## 2016-09-18 DIAGNOSIS — Z818 Family history of other mental and behavioral disorders: Secondary | ICD-10-CM | POA: Diagnosis not present

## 2016-09-18 DIAGNOSIS — Z811 Family history of alcohol abuse and dependence: Secondary | ICD-10-CM

## 2016-09-18 DIAGNOSIS — F900 Attention-deficit hyperactivity disorder, predominantly inattentive type: Secondary | ICD-10-CM

## 2016-09-18 DIAGNOSIS — F909 Attention-deficit hyperactivity disorder, unspecified type: Secondary | ICD-10-CM | POA: Diagnosis not present

## 2016-09-18 DIAGNOSIS — F4310 Post-traumatic stress disorder, unspecified: Secondary | ICD-10-CM

## 2016-09-18 DIAGNOSIS — Z882 Allergy status to sulfonamides status: Secondary | ICD-10-CM

## 2016-09-18 DIAGNOSIS — Z880 Allergy status to penicillin: Secondary | ICD-10-CM

## 2016-09-18 DIAGNOSIS — F53 Puerperal psychosis: Secondary | ICD-10-CM

## 2016-09-18 DIAGNOSIS — Z79899 Other long term (current) drug therapy: Secondary | ICD-10-CM

## 2016-09-18 DIAGNOSIS — Z888 Allergy status to other drugs, medicaments and biological substances status: Secondary | ICD-10-CM

## 2016-09-18 DIAGNOSIS — F331 Major depressive disorder, recurrent, moderate: Secondary | ICD-10-CM

## 2016-09-18 DIAGNOSIS — H6502 Acute serous otitis media, left ear: Secondary | ICD-10-CM

## 2016-09-18 DIAGNOSIS — Z885 Allergy status to narcotic agent status: Secondary | ICD-10-CM

## 2016-09-18 DIAGNOSIS — Z87891 Personal history of nicotine dependence: Secondary | ICD-10-CM

## 2016-09-18 MED ORDER — AZITHROMYCIN 250 MG PO TABS: ORAL_TABLET | ORAL | 0 refills | Status: DC

## 2016-09-18 MED ORDER — FLUCONAZOLE 150 MG PO TABS: 150.0000 mg | ORAL_TABLET | Freq: Once | ORAL | 3 refills | Status: AC

## 2016-09-18 MED ORDER — AMPHETAMINE-DEXTROAMPHETAMINE 15 MG PO TABS: 7.5000 mg | ORAL_TABLET | Freq: Every day | ORAL | 0 refills | Status: DC

## 2016-09-18 MED ORDER — PREDNISONE 50 MG PO TABS: ORAL_TABLET | ORAL | 0 refills | Status: DC

## 2016-09-18 NOTE — Assessment & Plan Note (Signed)
Persistence. Adding prednisone for 5 days, azithromycin, all of these are safe during nursing. Return to see us if no better in a couple of weeks.

## 2016-09-18 NOTE — Progress Notes (Signed)
  Subjective:    CC:  Follow-up  HPI: Left ear pain: Was treated appropriately with a cephalosporin several days ago, has not had any relief and has severe left ear pain. She was diagnosed with serous otitis media. Currently breast-feeding. Symptoms are moderate, persistent, localized without radiation, no constitutional symptoms, no respiratory symptoms, no GI symptoms.  Past medical history:  Negative.  See flowsheet/record as well for more information.  Surgical history: Negative.  See flowsheet/record as well for more information.  Family history: Negative.  See flowsheet/record as well for more information.  Social history: Negative.  See flowsheet/record as well for more information.  Allergies, and medications have been entered into the medical record, reviewed, and no changes needed.   Review of Systems: No fevers, chills, night sweats, weight loss, chest pain, or shortness of breath.   Objective:    General: Well Developed, well nourished, and in no acute distress.  Neuro: Alert and oriented x3, extra-ocular muscles intact, sensation grossly intact.  HEENT: Normocephalic, atraumatic, pupils equal round reactive to light, neck supple, no masses, no lymphadenopathy, thyroid nonpalpable.  Oropharynx, nasopharynx unremarkable, left and panic membrane is somewhat erythematous with an effusion. Skin: Warm and dry, no rashes. Cardiac: Regular rate and rhythm, no murmurs rubs or gallops, no lower extremity edema.  Respiratory: Clear to auscultation bilaterally. Not using accessory muscles, speaking in full sentences.  Impression and Recommendations:    Acute serous otitis media, left ear Persistence. Adding prednisone for 5 days, azithromycin, all of these are safe during nursing. Return to see us if no better in a couple of weeks.  I spent 25 minutes with this patient, greater than 50% was face-to-face time counseling regarding the above diagnoses

## 2016-09-18 NOTE — Progress Notes (Signed)
BH MD/PA/NP OP Progress Note  09/18/2016 11:02 AM Morgan Miller  MRN:  [id number]  Chief Complaint:  Chief Complaint    Follow-up     Subjective:  Depression . Follow up  HPI: History of Present Illness:  Patient is a 36 years old [place] descent married female referred initially  by primary care physician for anxiety depression and possible PTSD. Also been diagnosed with ADHD.  Patient is 1 month post partum. Currently on adderall half tablet of 7.5mg   She has a long time C-section baby is doing good one-month smiles and also does not cry much. Patient has been some depression in the first 1 or 2 weeks but not feeling hopeless or suicidal or delusional. She feels not to get started on any medication as of now except the Adderall. She is breast-feeding.  Anxiety is manageable. Not worried about job or legal case She is not overly worried about her job stress she is currently on maternity leave  Husband is supportive  Aggravating factors; prior job stress. Postpartum Modifying factor : her husband.  No psychosis     Visit Diagnosis:    ICD-10-CM   1. Moderate episode of recurrent major depressive disorder (HCC) F33.1   2. PTSD (post-traumatic stress disorder) F43.10   3. Attention deficit hyperactivity disorder (ADHD), predominantly inattentive type F90.0     Past Psychiatric History: depression  Past Medical History:  Past Medical History:  Diagnosis Date  . Complication of anesthesia    epidural caused syncope  . Hypertension   . Preeclampsia in postpartum period     Past Surgical History:  Procedure Laterality Date  . CESAREAN SECTION       Family History:  Family History  Problem Relation Age of Onset  . Hyperlipidemia Mother   . Depression Mother   . Anxiety disorder Mother   . Diabetes Father   . Hyperlipidemia Father   . Hypertension Father   . Parkinson's disease Father   . Alcohol abuse Paternal Uncle   . Heart attack Paternal Grandfather      Social History:  Social History   Social History  . Marital status: Married    Spouse name: N/A  . Number of children: N/A  . Years of education: N/A   Occupational History  . supervisor    Social History Main Topics  . Smoking status: Former Smoker    Packs/day: 0.30    Types: Cigarettes  . Smokeless tobacco: Never Used  . Alcohol use No     Comment: Every couple of weeks  . Drug use: No  . Sexual activity: Yes    Partners: Male   Other Topics Concern  . None   Social History Narrative  . None    Allergies:  Allergies  Allergen Reactions  . Codeine Nausea Only  . Nifedipine     fatigue  . Penicillins   . Sulfamethoxazole Nausea And Vomiting    Metabolic Disorder Labs: Lab Results  Component Value Date   HGBA1C 5.7 (H) 07/14/2015   MPG 117 07/14/2015   MPG 126 (H) 07/25/2014   No results found for: PROLACTIN Lab Results  Component Value Date   CHOL 193 06/25/2013   TRIG 122 06/25/2013   HDL 48 06/25/2013   CHOLHDL 4.0 06/25/2013   VLDL 24 06/25/2013   LDLCALC 121 (H) 06/25/2013     Current Medications: Current Outpatient Prescriptions  Medication Sig Dispense Refill  . amphetamine-dextroamphetamine (ADDERALL) 15 MG tablet Take 0.5 tablets by mouth  daily. Take half to one divided dose a day 30 tablet 0  . cefdinir (OMNICEF) 300 MG capsule Take 2 capsules (600 mg total) by mouth daily. 14 capsule 0   No current facility-administered medications for this visit.       Psychiatric Specialty Exam: Review of Systems  Constitutional: Negative for fever.  Cardiovascular: Negative for chest pain.  Gastrointestinal: Negative for nausea.  Skin: Negative for rash.  Psychiatric/Behavioral: Negative for suicidal ideas.    currently breastfeeding.There is no height or weight on file to calculate BMI.  General Appearance: Casual  Eye Contact:  Fair  Speech:  Normal Rate  Volume:  Decreased  Mood: euthymic  Affect: pleasant, reactive  Thought  Process:  Goal Directed  Orientation:  Full (Time, Place, and Person)  Thought Content: clear  Suicidal Thoughts:  No  Homicidal Thoughts:  No  Memory:  Immediate;   Fair Recent;   Good  Judgement:  Fair  Insight:  Fair  Psychomotor Activity:  Normal  Concentration:  Concentration: Fair and Attention Span: Fair  Recall:  [employer]  Fund of Knowledge: Fair  Language: Fair  Akathisia:  Negative  Handed:  Right  AIMS (if indicated):    Assets:  Husband support  ADL's:  Intact  Cognition: WNL  Sleep:  Fair to variable     Treatment Plan Summary:Medication management and Plan as follows   1. ADHD: manageable with low dose adderall. She does not want to stop for now  2. PTSD: baseline. Not worse   3. Depression: postpartum: not worse. Does not want to start celexa for now. Supportive husband  Reviewed CONCERNS and risk benefit ratio being on medication as of now she is doing fair and stable does not want to get back on Celexa as an outpatient stance she is taking Adderall a small dose as her choice Follow-up in 4-6 weeks follow-up with primary care physician she has an appointment and will get her regular physical exam done there today.    Thresa RossAKHTAR, Raegan Sipp, MD 09/18/2016, 11:02 AM

## 2016-09-20 ENCOUNTER — Telehealth: Payer: Self-pay | Admitting: Sports Medicine

## 2016-09-20 DIAGNOSIS — H6590 Unspecified nonsuppurative otitis media, unspecified ear: Secondary | ICD-10-CM | POA: Diagnosis not present

## 2016-09-20 DIAGNOSIS — H6982 Other specified disorders of Eustachian tube, left ear: Secondary | ICD-10-CM | POA: Diagnosis not present

## 2016-09-20 MED ORDER — FLUTICASONE PROPIONATE 50 MCG/ACT NA SUSP: NASAL | 3 refills | Status: AC

## 2016-09-20 NOTE — Telephone Encounter (Signed)
OK fine, can use flonase instead.

## 2016-09-20 NOTE — Telephone Encounter (Signed)
Probably should've done the opposite, I would advise continuing the prednisone which could potentially increase the heart rate. Did she feel bad during the bradycardic episode or just noticed it when checking her heart rate?

## 2016-09-20 NOTE — Telephone Encounter (Signed)
Pt reports she "felt fatigued," and "like her heart was going to just slow down then stop." She has not taken the prednisone today and reports no symptoms since stopping.

## 2016-09-20 NOTE — Telephone Encounter (Signed)
Left recommendation on Pt's VM.  

## 2016-09-20 NOTE — Telephone Encounter (Signed)
Pt states since starting the z-pac and the prednisone she has experienced some bradycardia- mostly at night. Pt reports last night her pulse dropped down into the 50's and she "felt like it was going to just stop."   Pt states she has taken a z-pac before and never had this problem so she stopped the prednisone and is only taking the antibiotic. Questions if she should try something different. Routing for review.

## 2016-10-03 DIAGNOSIS — Z6827 Body mass index (BMI) 27.0-27.9, adult: Secondary | ICD-10-CM | POA: Diagnosis not present

## 2016-10-03 DIAGNOSIS — Z124 Encounter for screening for malignant neoplasm of cervix: Secondary | ICD-10-CM | POA: Diagnosis not present

## 2016-10-17 ENCOUNTER — Telehealth (HOSPITAL_COMMUNITY): Payer: Self-pay | Admitting: Psychiatry

## 2016-10-17 MED ORDER — AMPHETAMINE-DEXTROAMPHETAMINE 15 MG PO TABS: 7.5000 mg | ORAL_TABLET | Freq: Every day | ORAL | 0 refills | Status: DC

## 2016-10-17 NOTE — Telephone Encounter (Signed)
Pt needs refill on adderall °

## 2016-10-17 NOTE — Telephone Encounter (Signed)
adderall printed for pick up 

## 2016-10-17 NOTE — Telephone Encounter (Signed)
Lvm informing pt Adderall prescription is ready for pickup.

## 2016-10-30 ENCOUNTER — Telehealth (HOSPITAL_COMMUNITY): Payer: Self-pay | Admitting: Psychiatry

## 2016-10-30 MED ORDER — CITALOPRAM HYDROBROMIDE 10 MG PO TABS: 10.0000 mg | ORAL_TABLET | Freq: Every day | ORAL | 0 refills | Status: DC

## 2016-10-30 NOTE — Telephone Encounter (Signed)
Was stopped during pregnancy. Can start now low dose of 10mg 

## 2016-10-30 NOTE — Telephone Encounter (Signed)
Per last visit on 09/18/16, pt states she was not taking Celexa. Please advise.

## 2016-10-30 NOTE — Telephone Encounter (Signed)
Can send refill 

## 2016-10-30 NOTE — Telephone Encounter (Signed)
Per Dr. Gilmore LarocheAkhtar, refill for Celexa 10mg , #30 was sent to [facility]. Lvm informing pt of refill status. Pt's next apt is schedule on 11/04/16. Nothing further is need at this time.

## 2016-10-30 NOTE — Telephone Encounter (Signed)
Pt needs refill on [facility]lexa [facility] [address].

## 2016-11-04 ENCOUNTER — Ambulatory Visit (INDEPENDENT_AMBULATORY_CARE_PROVIDER_SITE_OTHER): Payer: Federal, State, Local not specified - PPO | Admitting: Psychiatry

## 2016-11-04 ENCOUNTER — Encounter (HOSPITAL_COMMUNITY): Payer: Self-pay | Admitting: Psychiatry

## 2016-11-04 VITALS — BP 120/80 | HR 77 | Resp 16 | Ht 67.0 in | Wt 171.0 lb

## 2016-11-04 DIAGNOSIS — F331 Major depressive disorder, recurrent, moderate: Secondary | ICD-10-CM

## 2016-11-04 DIAGNOSIS — Z87891 Personal history of nicotine dependence: Secondary | ICD-10-CM

## 2016-11-04 DIAGNOSIS — Z811 Family history of alcohol abuse and dependence: Secondary | ICD-10-CM

## 2016-11-04 DIAGNOSIS — F4310 Post-traumatic stress disorder, unspecified: Secondary | ICD-10-CM

## 2016-11-04 DIAGNOSIS — F900 Attention-deficit hyperactivity disorder, predominantly inattentive type: Secondary | ICD-10-CM | POA: Diagnosis not present

## 2016-11-04 DIAGNOSIS — F410 Panic disorder [episodic paroxysmal anxiety] without agoraphobia: Secondary | ICD-10-CM

## 2016-11-04 DIAGNOSIS — Z818 Family history of other mental and behavioral disorders: Secondary | ICD-10-CM | POA: Diagnosis not present

## 2016-11-04 MED ORDER — AMPHETAMINE-DEXTROAMPHETAMINE 15 MG PO TABS
7.5000 mg | ORAL_TABLET | Freq: Every day | ORAL | 0 refills | Status: DC
Start: 2016-11-04 — End: 2016-12-13

## 2016-11-04 NOTE — Progress Notes (Signed)
BH MD/PA/NP OP Progress Note  11/04/2016 10:28 AM Morgan Miller  MRN:  [id number]  Chief Complaint:  Chief Complaint    Follow-up     Subjective:  Depression . Follow up  HPI: History of Present Illness:  Patient is a 36 years old [place] American descent married female referred initially  by primary care physician for anxiety depression and possible PTSD. Also been diagnosed with ADHD.  Patient is 11 weeks postpartum Patient dad is terminally sick to his in [place] disaffected and she is feeling somewhat down so she called the mania started back Celexa she has been taking for a few days. She does have the support system her kids are doing reasonable she does have a good relationship with her husband as of now she is also worried about going back to work says that she is not ready.  Anxiety : worries about her dad and job   Husband is supportive  Aggravating factors; prior job stress Modifying factor : husband.  No psychosis     Visit Diagnosis:    ICD-10-CM   1. Moderate episode of recurrent major depressive disorder (HCC) F33.1   2. PTSD (post-traumatic stress disorder) F43.10   3. Attention deficit hyperactivity disorder (ADHD), predominantly inattentive type F90.0   4. Panic disorder F41.0     Past Psychiatric History: depression  Past Medical History:  Past Medical History:  Diagnosis Date  . Complication of anesthesia    epidural caused syncope  . Hypertension   . Preeclampsia in postpartum period     Past Surgical History:  Procedure Laterality Date  . CESAREAN SECTION       Family History:  Family History  Problem Relation Age of Onset  . Hyperlipidemia Mother   . Depression Mother   . Anxiety disorder Mother   . Diabetes Father   . Hyperlipidemia Father   . Hypertension Father   . Parkinson's disease Father   . Alcohol abuse Paternal Uncle   . Heart attack Paternal Grandfather     Social History:  Social History   Social History  . Marital  status: Married    Spouse name: N/A  . Number of children: N/A  . Years of education: N/A   Occupational History  . supervisor    Social History Main Topics  . Smoking status: Former Smoker    Packs/day: 0.30    Types: Cigarettes  . Smokeless tobacco: Never Used  . Alcohol use No     Comment: Every couple of weeks  . Drug use: No  . Sexual activity: Yes    Partners: Male   Other Topics Concern  . None   Social History Narrative  . None    Allergies:  Allergies  Allergen Reactions  . Codeine Nausea Only  . Nifedipine     fatigue  . Penicillins   . Sulfamethoxazole Nausea And Vomiting    Metabolic Disorder Labs: Lab Results  Component Value Date   HGBA1C 5.7 (H) 07/14/2015   MPG 117 07/14/2015   MPG 126 (H) 07/25/2014   No results found for: PROLACTIN Lab Results  Component Value Date   CHOL 193 06/25/2013   TRIG 122 06/25/2013   HDL 48 06/25/2013   CHOLHDL 4.0 06/25/2013   VLDL 24 06/25/2013   LDLCALC 121 (H) 06/25/2013     Current Medications: Current Outpatient Prescriptions  Medication Sig Dispense Refill  . amphetamine-dextroamphetamine (ADDERALL) 15 MG tablet Take 0.5 tablets by mouth daily. Take half to one divided dose  a day 30 tablet 0  . azithromycin (ZITHROMAX Z-PAK) 250 MG tablet Take 2 tablets (500 mg) on  Day 1,  followed by 1 tablet (250 mg) once daily on Days 2 through 5. 6 tablet 0  . citalopram (CELEXA) 10 MG tablet Take 1 tablet (10 mg total) by mouth daily. 30 tablet 0  . fluticasone (FLONASE) 50 MCG/ACT nasal spray One spray in each nostril twice a day, use left hand for right nostril, and right hand for left nostril. 48 g 3  . predniSONE (DELTASONE) 50 MG tablet One tab PO daily for 5 days. 5 tablet 0   No current facility-administered medications for this visit.       Psychiatric Specialty Exam: Review of Systems  Constitutional: Negative for fever.  Cardiovascular: Negative for chest pain.  Gastrointestinal: Negative for  nausea.  Skin: Negative for rash.  Neurological: Negative for tremors.  Psychiatric/Behavioral: Negative for suicidal ideas.    Blood pressure 120/80, pulse 77, resp. rate 16, height 5\' 7"  (1.702 m), weight 171 lb (77.6 kg), SpO2 97 %, currently breastfeeding.Body mass index is 26.78 kg/m.  General Appearance: Casual  Eye Contact:  Fair  Speech:  Normal Rate  Volume:  Decreased  Mood: somewhat stressed due to her dad sickness  Affect: congruent  Thought Process:  Goal Directed  Orientation:  Full (Time, Place, and Person)  Thought Content: clear  Suicidal Thoughts:  No  Homicidal Thoughts:  No  Memory:  Immediate;   Fair Recent;   Good  Judgement:  Fair  Insight:  Fair  Psychomotor Activity:  Normal  Concentration:  Concentration: Fair and Attention Span: Fair  Recall:  [employer]Fair  Fund of Knowledge: Fair  Language: Fair  Akathisia:  Negative  Handed:  Right  AIMS (if indicated):    Assets:  Husband support  ADL's:  Intact  Cognition: WNL  Sleep:  Fair to variable     Treatment Plan Summary:Medication management and Plan as follows   1. ADHD: manageable with low dose adderall refill given . May need higher dose when back to work  2. PTSD: baseline . Continue meds   3. Depression: postpartum: added factor dad sickness. Have restarted celexa. Will monitor  Provided supportive therapy. Reviewed questions. FU 4 weeks or earlier if needed  Thresa RossAKHTAR, Kmya Placide, MD 11/04/2016, 10:28 AM

## 2016-11-25 ENCOUNTER — Other Ambulatory Visit (HOSPITAL_COMMUNITY): Payer: Self-pay | Admitting: Psychiatry

## 2016-11-28 NOTE — Telephone Encounter (Signed)
Medication refill- received fax from [facility] requesting a refill request for Celexa. Per Dr. Gilmore Laroche, refill request is denied. Pt will need an apt. Lvm for pt to call the office.

## 2016-12-01 ENCOUNTER — Other Ambulatory Visit (HOSPITAL_COMMUNITY): Payer: Self-pay | Admitting: Psychiatry

## 2016-12-04 NOTE — Telephone Encounter (Signed)
Medication refill-  Received fax from [facility] requesting a refill for Celexa. Per Dr. Gilmore Laroche, refill for Celexa 10mg , #30 was authorize. Rx sent to [facility]. Lvm informing pt of refill status. Pt's next apt is schedule on 12/05/16. Nothing further is need at this time.

## 2016-12-05 ENCOUNTER — Ambulatory Visit (HOSPITAL_COMMUNITY): Payer: Self-pay | Admitting: Psychiatry

## 2016-12-13 ENCOUNTER — Encounter (HOSPITAL_COMMUNITY): Payer: Self-pay | Admitting: Psychiatry

## 2016-12-13 ENCOUNTER — Ambulatory Visit (INDEPENDENT_AMBULATORY_CARE_PROVIDER_SITE_OTHER): Payer: Federal, State, Local not specified - PPO | Admitting: Psychiatry

## 2016-12-13 VITALS — BP 116/74 | HR 75 | Resp 16 | Ht 67.0 in | Wt 171.0 lb

## 2016-12-13 DIAGNOSIS — Z811 Family history of alcohol abuse and dependence: Secondary | ICD-10-CM

## 2016-12-13 DIAGNOSIS — Z87891 Personal history of nicotine dependence: Secondary | ICD-10-CM

## 2016-12-13 DIAGNOSIS — F331 Major depressive disorder, recurrent, moderate: Secondary | ICD-10-CM | POA: Diagnosis not present

## 2016-12-13 DIAGNOSIS — F4310 Post-traumatic stress disorder, unspecified: Secondary | ICD-10-CM

## 2016-12-13 DIAGNOSIS — Z818 Family history of other mental and behavioral disorders: Secondary | ICD-10-CM | POA: Diagnosis not present

## 2016-12-13 DIAGNOSIS — F900 Attention-deficit hyperactivity disorder, predominantly inattentive type: Secondary | ICD-10-CM | POA: Diagnosis not present

## 2016-12-13 DIAGNOSIS — F410 Panic disorder [episodic paroxysmal anxiety] without agoraphobia: Secondary | ICD-10-CM

## 2016-12-13 MED ORDER — CITALOPRAM HYDROBROMIDE 10 MG PO TABS: 10.0000 mg | ORAL_TABLET | Freq: Every day | ORAL | 1 refills | Status: DC

## 2016-12-13 MED ORDER — AMPHETAMINE-DEXTROAMPHETAMINE 15 MG PO TABS: 7.5000 mg | ORAL_TABLET | Freq: Every day | ORAL | 0 refills | Status: DC

## 2016-12-13 NOTE — Progress Notes (Signed)
BH MD/PA/NP OP Progress Note  12/13/2016 10:48 AM Morgan Miller  MRN:  [id number]  Chief Complaint:  Chief Complaint    Follow-up     Subjective:  Depression . Follow up  HPI: History of Present Illness:  Patient is a 36 years old [place] descent married female referred initially  by primary care physician for anxiety depression and possible PTSD. Also been diagnosed with ADHD.  Patient is 4 months  Postpartum  Patient's doing better some depression at times now looking back or according to office Tuesday see how she is just over there. Maybe is doing good anxiety not worse Inattention is reasonable with the small dose ofAdderall Celexa has helped anxiety and depression as well.  Husband is supportive  Aggravating factors; prior job stress. Post partum Modifying factor : husband .  No psychosis     Visit Diagnosis:    ICD-10-CM   1. Moderate episode of recurrent major depressive disorder (HCC) F33.1   2. PTSD (post-traumatic stress disorder) F43.10   3. Attention deficit hyperactivity disorder (ADHD), predominantly inattentive type F90.0   4. Panic disorder F41.0     Past Psychiatric History: depression  Past Medical History:  Past Medical History:  Diagnosis Date  . Complication of anesthesia    epidural caused syncope  . Hypertension   . Preeclampsia in postpartum period     Past Surgical History:  Procedure Laterality Date  . CESAREAN SECTION       Family History:  Family History  Problem Relation Age of Onset  . Hyperlipidemia Mother   . Depression Mother   . Anxiety disorder Mother   . Diabetes Father   . Hyperlipidemia Father   . Hypertension Father   . Parkinson's disease Father   . Alcohol abuse Paternal Uncle   . Heart attack Paternal Grandfather     Social History:  Social History   Social History  . Marital status: Married    Spouse name: N/A  . Number of children: N/A  . Years of education: N/A   Occupational History  .  supervisor    Social History Main Topics  . Smoking status: Former Smoker    Packs/day: 0.30    Types: Cigarettes  . Smokeless tobacco: Never Used  . Alcohol use No     Comment: Every couple of weeks  . Drug use: No  . Sexual activity: Yes    Partners: Male   Other Topics Concern  . None   Social History Narrative  . None    Allergies:  Allergies  Allergen Reactions  . Codeine Nausea Only  . Nifedipine     fatigue  . Penicillins   . Sulfamethoxazole Nausea And Vomiting    Metabolic Disorder Labs: Lab Results  Component Value Date   HGBA1C 5.7 (H) 07/14/2015   MPG 117 07/14/2015   MPG 126 (H) 07/25/2014   No results found for: PROLACTIN Lab Results  Component Value Date   CHOL 193 06/25/2013   TRIG 122 06/25/2013   HDL 48 06/25/2013   CHOLHDL 4.0 06/25/2013   VLDL 24 06/25/2013   LDLCALC 121 (H) 06/25/2013     Current Medications: Current Outpatient Prescriptions  Medication Sig Dispense Refill  . amphetamine-dextroamphetamine (ADDERALL) 15 MG tablet Take 0.5 tablets by mouth daily. Take half to one divided dose a day 30 tablet 0  . azithromycin (ZITHROMAX Z-PAK) 250 MG tablet Take 2 tablets (500 mg) on  Day 1,  followed by 1 tablet (250 mg) once  daily on Days 2 through 5. 6 tablet 0  . citalopram (CELEXA) 10 MG tablet Take 1 tablet (10 mg total) by mouth daily. 30 tablet 1  . fluticasone (FLONASE) 50 MCG/ACT nasal spray One spray in each nostril twice a day, use left hand for right nostril, and right hand for left nostril. 48 g 3  . predniSONE (DELTASONE) 50 MG tablet One tab PO daily for 5 days. 5 tablet 0   No current facility-administered medications for this visit.       Psychiatric Specialty Exam: Review of Systems  Constitutional: Negative for fever.  Cardiovascular: Negative for palpitations.  Gastrointestinal: Negative for nausea.  Skin: Negative for rash.  Neurological: Negative for tremors.  Psychiatric/Behavioral: Negative for depression  and suicidal ideas.    Blood pressure 116/74, pulse 75, resp. rate 16, height  (1.702 m), weight 171 lb (77.6 kg), SpO2 96 %, currently breastfeeding.Body mass index is 26.78 kg/m.  General Appearance: Casual  Eye Contact:  Fair  Speech:  Normal Rate  Volume:  Decreased  Mood:fair   Affect:  Congruent and reactive  Thought Process:  Goal Directed  Orientation:  Full (Time, Place, and Person)  Thought Content: clear  Suicidal Thoughts:  No  Homicidal Thoughts:  No  Memory:  Immediate;   Fair Recent;   Good  Judgement:  Fair  Insight:  Fair  Psychomotor Activity:  Normal  Concentration:  Concentration: Fair and Attention Span: Fair  Recall:  [employer] of Knowledge: Fair  Language: Fair  Akathisia:  Negative  Handed:  Right  AIMS (if indicated):    Assets:  Husband support  ADL's:  Intact  Cognition: WNL  Sleep:  Fair to variable     Treatment Plan Summary:Medication management and Plan as follows   1. ADHD: manageable on low dose adderall. Refill given  2. PTSD: baseline. Continue meds. May exacerbate once joins work   3. Depression: postpartum:  Manageable for now. Continue celexa   Provided supportive therapy continue current medication as planned follow-up in 4 weeks or earlier if needed  Thresa Ross, MD 12/13/2016, 10:48 AM

## 2016-12-25 ENCOUNTER — Other Ambulatory Visit: Payer: Self-pay | Admitting: Physician Assistant

## 2016-12-29 ENCOUNTER — Other Ambulatory Visit: Payer: Self-pay | Admitting: Physician Assistant

## 2016-12-30 ENCOUNTER — Other Ambulatory Visit: Payer: Self-pay | Admitting: Physician Assistant

## 2016-12-30 ENCOUNTER — Telehealth: Payer: Self-pay

## 2016-12-30 MED ORDER — ALPRAZOLAM 0.5 MG PO TABS: 0.5000 mg | ORAL_TABLET | Freq: Two times a day (BID) | ORAL | 0 refills | Status: DC | PRN

## 2016-12-30 NOTE — Telephone Encounter (Signed)
I gave her 20 tablets to use as needed. If she needs more then she will need to make appt.

## 2016-12-30 NOTE — Telephone Encounter (Signed)
Pt is requesting a refill on her Xanax.  Please advise if ok to refill. She has been having panic attacks, had to take her father off life support last week.

## 2016-12-31 NOTE — Telephone Encounter (Signed)
Left VM with information and contact information for any questions.

## 2017-01-16 ENCOUNTER — Encounter: Payer: Self-pay | Admitting: Physician Assistant

## 2017-01-27 ENCOUNTER — Telehealth (HOSPITAL_COMMUNITY): Payer: Self-pay | Admitting: Psychiatry

## 2017-01-27 MED ORDER — AMPHETAMINE-DEXTROAMPHETAMINE 15 MG PO TABS: 7.5000 mg | ORAL_TABLET | Freq: Every day | ORAL | 0 refills | Status: DC

## 2017-01-27 NOTE — Telephone Encounter (Signed)
Medication refill- pt called office requesting refills for Adderall and Celexa. Adderall rx was last filled on 12/13/16. Celexa rx was last filled on 12/13/16 w/ 1 refill.   Per Dr. Gilmore LarocheAkhtar, refill is authorize for Adderall 15mg , #30. Rx was printed for pick up. Celexa rx is denied. Rx is too early to fill. Next refill is due on 02/07/17. Pt is schedule for a follow up apt on 02/07/17. Lvm informing pt of refill status. Nothing further is need at this time.

## 2017-01-27 NOTE — Telephone Encounter (Signed)
Pt had to rschd appt from this Friday to next Friday. Pt needs refill on meds.

## 2017-01-31 ENCOUNTER — Ambulatory Visit (HOSPITAL_COMMUNITY): Payer: Self-pay | Admitting: Psychiatry

## 2017-02-07 ENCOUNTER — Ambulatory Visit (INDEPENDENT_AMBULATORY_CARE_PROVIDER_SITE_OTHER): Payer: Federal, State, Local not specified - PPO | Admitting: Psychiatry

## 2017-02-07 ENCOUNTER — Encounter (HOSPITAL_COMMUNITY): Payer: Self-pay | Admitting: Psychiatry

## 2017-02-07 VITALS — BP 128/76 | HR 92 | Resp 18 | Ht 67.0 in | Wt 170.0 lb

## 2017-02-07 DIAGNOSIS — F900 Attention-deficit hyperactivity disorder, predominantly inattentive type: Secondary | ICD-10-CM | POA: Diagnosis not present

## 2017-02-07 DIAGNOSIS — F4310 Post-traumatic stress disorder, unspecified: Secondary | ICD-10-CM | POA: Diagnosis not present

## 2017-02-07 DIAGNOSIS — F331 Major depressive disorder, recurrent, moderate: Secondary | ICD-10-CM

## 2017-02-07 DIAGNOSIS — Z811 Family history of alcohol abuse and dependence: Secondary | ICD-10-CM | POA: Diagnosis not present

## 2017-02-07 DIAGNOSIS — Z634 Disappearance and death of family member: Secondary | ICD-10-CM | POA: Diagnosis not present

## 2017-02-07 DIAGNOSIS — F410 Panic disorder [episodic paroxysmal anxiety] without agoraphobia: Secondary | ICD-10-CM

## 2017-02-07 DIAGNOSIS — Z818 Family history of other mental and behavioral disorders: Secondary | ICD-10-CM | POA: Diagnosis not present

## 2017-02-07 DIAGNOSIS — Z87891 Personal history of nicotine dependence: Secondary | ICD-10-CM

## 2017-02-07 MED ORDER — CITALOPRAM HYDROBROMIDE 10 MG PO TABS: 15.0000 mg | ORAL_TABLET | Freq: Every day | ORAL | 1 refills | Status: DC

## 2017-02-07 MED ORDER — AMPHETAMINE-DEXTROAMPHETAMINE 15 MG PO TABS
7.5000 mg | ORAL_TABLET | Freq: Every day | ORAL | 0 refills | Status: DC
Start: 2017-02-07 — End: 2017-04-24

## 2017-02-07 NOTE — Progress Notes (Signed)
BH MD/PA/NP OP Progress Note  02/07/2017 12:19 PM Morgan Miller  MRN:  [id number]  Chief Complaint:  Chief Complaint    Follow-up     Subjective:  Depression . Follow up  HPI: History of Present Illness:  Patient is a 36 years old [place] American descent married female referred initially  by primary care physician for anxiety depression and possible PTSD. Also been diagnosed with ADHD.  Patient is 6 months postpartum Her dad died in [place] September. Difficult dealing with that. Support is her brother. Step mom not returning calls  Depression is worse due to above but she is trying to work and otherthings to keep distracted  Husband is supportive but not talkative in regard to giving comfort  Aggravating factors; prior job stress. Post partum. Dad death Modifying factor : husband somewhat .  No psychosis     Visit Diagnosis:    ICD-10-CM   1. Moderate episode of recurrent major depressive disorder (HCC) F33.1   2. PTSD (post-traumatic stress disorder) F43.10   3. Attention deficit hyperactivity disorder (ADHD), predominantly inattentive type F90.0   4. Panic disorder F41.0     Past Psychiatric History: depression  Past Medical History:  Past Medical History:  Diagnosis Date  . Complication of anesthesia    epidural caused syncope  . Hypertension   . Preeclampsia in postpartum period     Past Surgical History:  Procedure Laterality Date  . CESAREAN SECTION       Family History:  Family History  Problem Relation Age of Onset  . Hyperlipidemia Mother   . Depression Mother   . Anxiety disorder Mother   . Diabetes Father   . Hyperlipidemia Father   . Hypertension Father   . Parkinson's disease Father   . Alcohol abuse Paternal Uncle   . Heart attack Paternal Grandfather     Social History:  Social History   Social History  . Marital status: Married    Spouse name: N/A  . Number of children: N/A  . Years of education: N/A   Occupational History  .  supervisor    Social History Main Topics  . Smoking status: Former Smoker    Packs/day: 0.30    Types: Cigarettes  . Smokeless tobacco: Never Used  . Alcohol use No     Comment: Every couple of weeks  . Drug use: No  . Sexual activity: Yes    Partners: Male   Other Topics Concern  . None   Social History Narrative  . None    Allergies:  Allergies  Allergen Reactions  . Codeine Nausea Only  . Nifedipine     fatigue  . Penicillins   . Sulfamethoxazole Nausea And Vomiting    Metabolic Disorder Labs: Lab Results  Component Value Date   HGBA1C 5.7 (H) 07/14/2015   MPG 117 07/14/2015   MPG 126 (H) 07/25/2014   No results found for: PROLACTIN Lab Results  Component Value Date   CHOL 193 06/25/2013   TRIG 122 06/25/2013   HDL 48 06/25/2013   CHOLHDL 4.0 06/25/2013   VLDL 24 06/25/2013   LDLCALC 121 (H) 06/25/2013     Current Medications: Current Outpatient Prescriptions  Medication Sig Dispense Refill  . ALPRAZolam (XANAX) 0.5 MG tablet Take 1 tablet (0.5 mg total) by mouth 2 (two) times daily as needed for anxiety. 20 tablet 0  . amphetamine-dextroamphetamine (ADDERALL) 15 MG tablet Take 0.5 tablets by mouth daily. Take half to one divided dose a day 30  tablet 0  . azithromycin (ZITHROMAX Z-PAK) 250 MG tablet Take 2 tablets (500 mg) on  Day 1,  followed by 1 tablet (250 mg) once daily on Days 2 through 5. 6 tablet 0  . citalopram (CELEXA) 10 MG tablet Take 1.5 tablets (15 mg total) by mouth daily. 45 tablet 1  . fluticasone (FLONASE) 50 MCG/ACT nasal spray One spray in each nostril twice a day, use left hand for right nostril, and right hand for left nostril. 48 g 3  . predniSONE (DELTASONE) 50 MG tablet One tab PO daily for 5 days. 5 tablet 0   No current facility-administered medications for this visit.       Psychiatric Specialty Exam: Review of Systems  Constitutional: Negative for fever.  Cardiovascular: Negative for chest pain.  Gastrointestinal:  Negative for nausea.  Skin: Negative for rash.  Neurological: Negative for tremors.  Psychiatric/Behavioral: Positive for depression. Negative for suicidal ideas.    Blood pressure 128/76, pulse 92, resp. rate 18, height 5\' 7"  (1.702 m), weight 170 lb (77.1 kg), SpO2 97 %, currently breastfeeding.Body mass index is 26.63 kg/m.  General Appearance: Casual  Eye Contact:  Fair  Speech:  Normal Rate  Volume:  Decreased  Mood: dysphoric  Affect:  tearful  Thought Process:  Goal Directed  Orientation:  Full (Time, Place, and Person)  Thought Content: clear  Suicidal Thoughts:  No  Homicidal Thoughts:  No  Memory:  Immediate;   Fair Recent;   Good  Judgement:  Fair  Insight:  Fair  Psychomotor Activity:  Normal  Concentration:  Concentration: Fair and Attention Span: Fair  Recall:  [employer] of Knowledge: Fair  Language: Fair  Akathisia:  Negative  Handed:  Right  AIMS (if indicated):    Assets:  Husband support  ADL's:  Intact  Cognition: WNL  Sleep:  Fair to variable     Treatment Plan Summary:Medication management and Plan as follows   Grif with depression due to death of her dad . Step mom not gave her anything. She is trying to get lawyer Remains tearufl. Will increase celexa to 15mg . Not suicidal  1. ADHD: not worse. On adderall can continue low dose dose 2. PTSD: baseline. Recently aggravated due to stress   3. Depression: postpartum but complicated with grief now. Will increase celexa a as above  Provided supportive therapy. Fu early due to depression.  Thresa Ross, MD 02/07/2017, 12:19 PM

## 2017-02-12 ENCOUNTER — Telehealth (HOSPITAL_COMMUNITY): Payer: Self-pay | Admitting: *Deleted

## 2017-02-12 NOTE — Telephone Encounter (Signed)
Received FMLA paperwork from [employer]. Paperwork complete. Lvm informing pt paperwork is ready for pickup.

## 2017-03-16 IMAGING — US US OB TRANSVAGINAL
1 series · 14 of 28 positions shown · non-contrast
Comparison: None.

CLINICAL DATA: Positive pregnancy test, LMP 06/04/2014

EXAM:
OBSTETRIC <14 WK US AND TRANSVAGINAL OB US
TECHNIQUE: Both transabdominal and transvaginal ultrasound examinations were
performed for complete evaluation of the gestation as well as the
maternal uterus, adnexal regions, and pelvic cul-de-sac.
Transvaginal technique was performed to assess early pregnancy.

[Series 1: us ob transvaginal · 0.16mm/px · 14 of 110 slices shown]
[im 5/110]
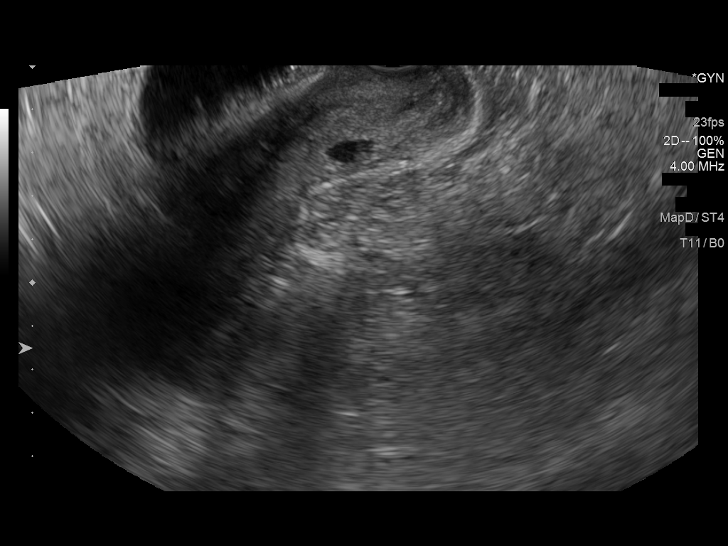
[im 13/110]
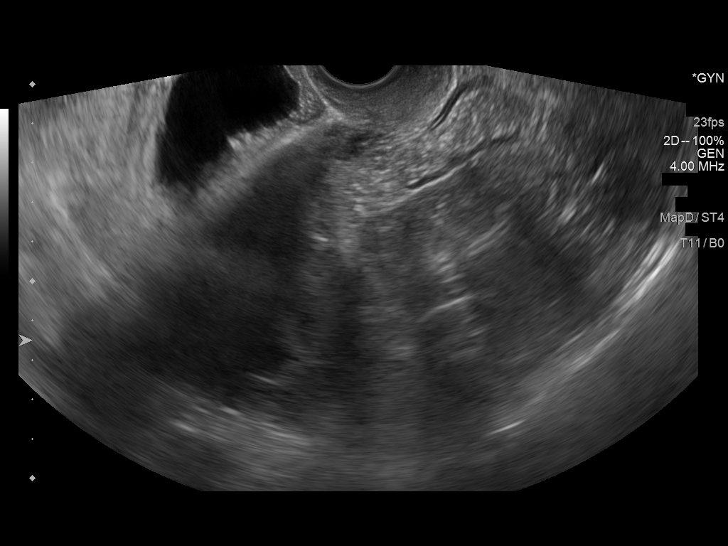
[im 21/110]
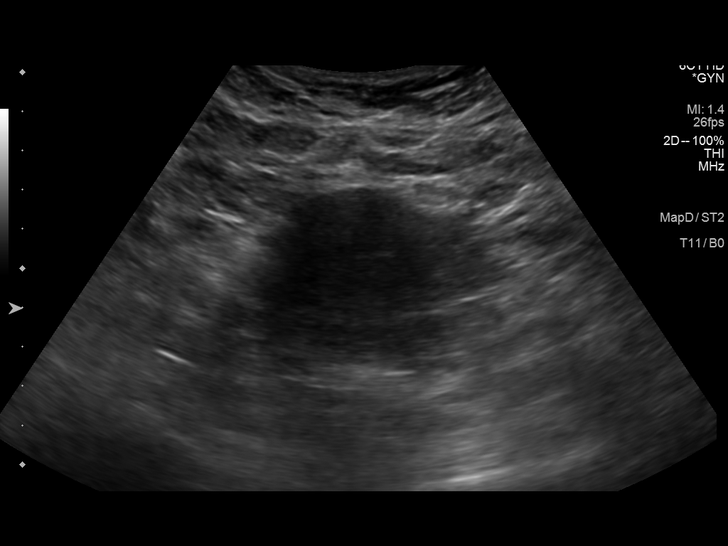
[im 29/110]
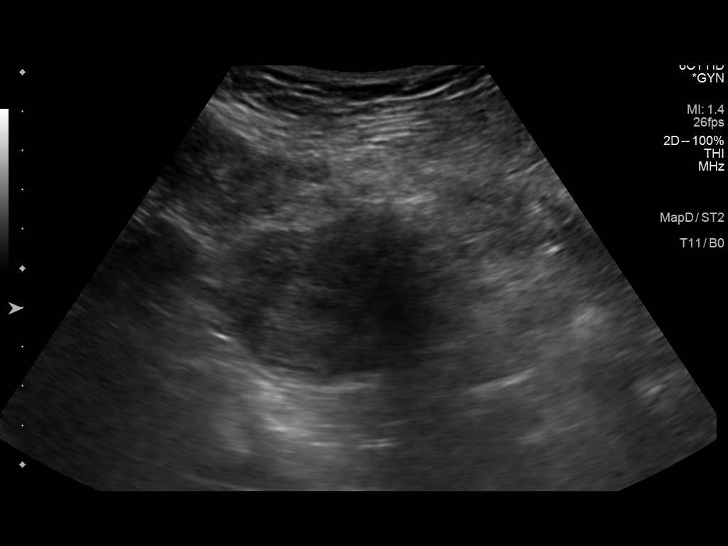
[im 37/110]
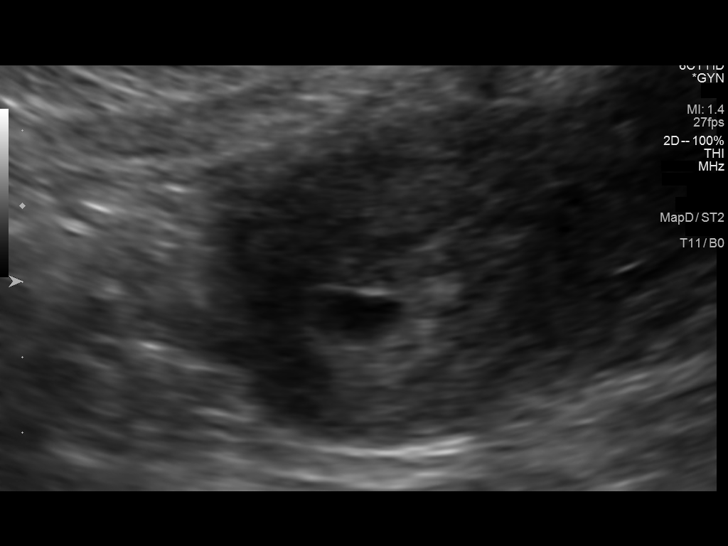
[im 45/110]
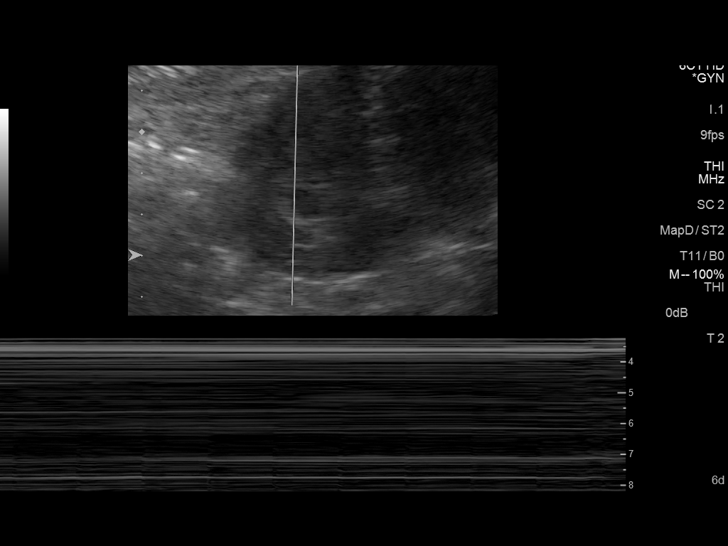
[im 53/110]
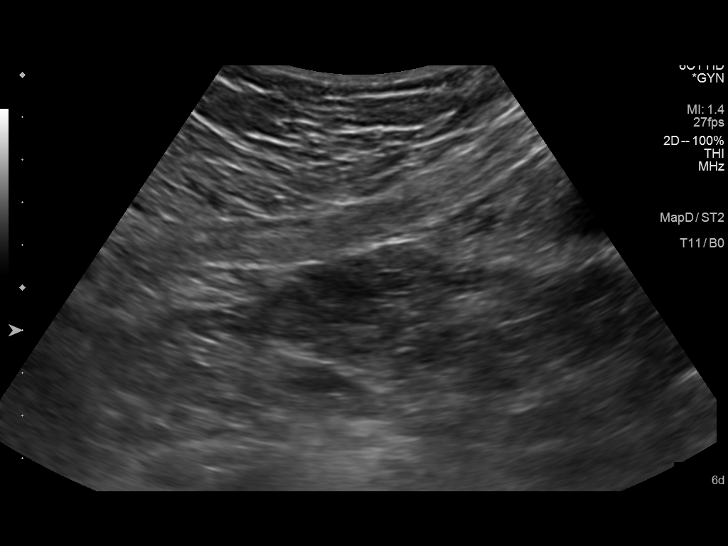
[im 61/110]
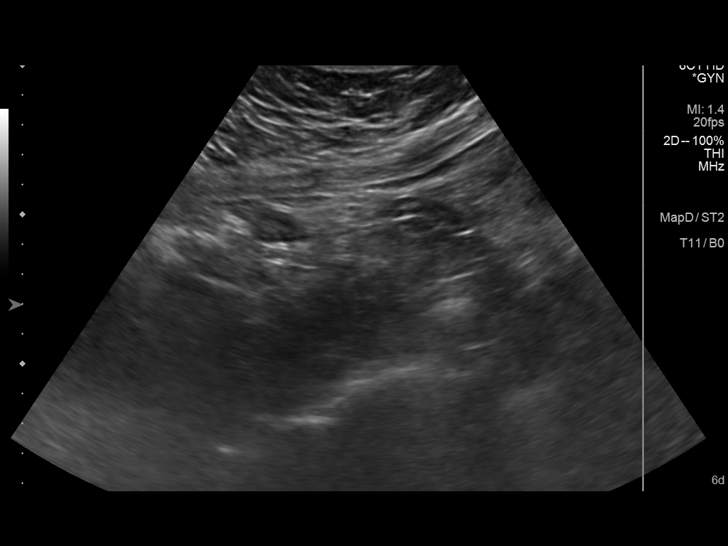
[im 69/110]
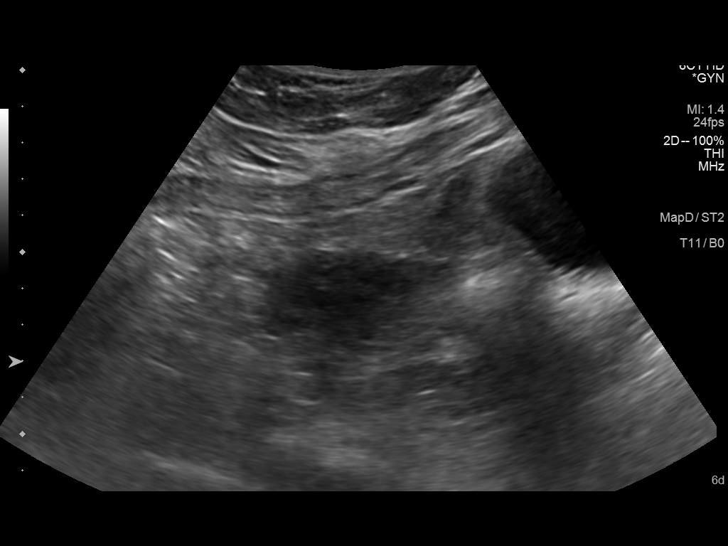
[im 77/110]
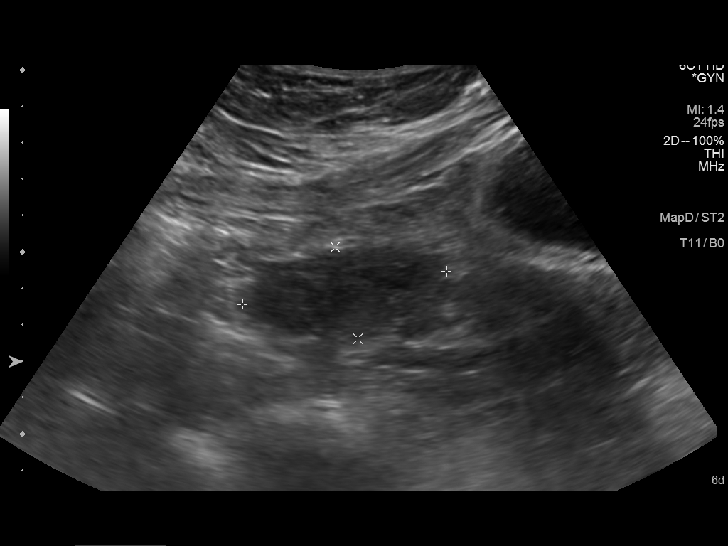
[im 85/110]
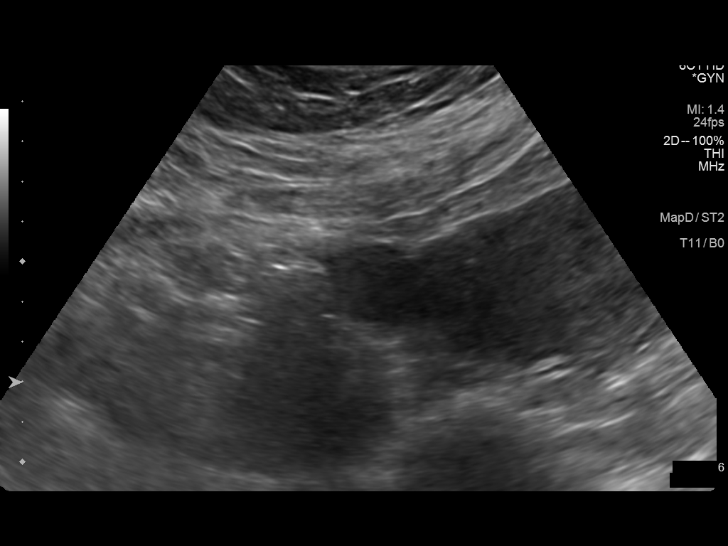
[im 93/110]
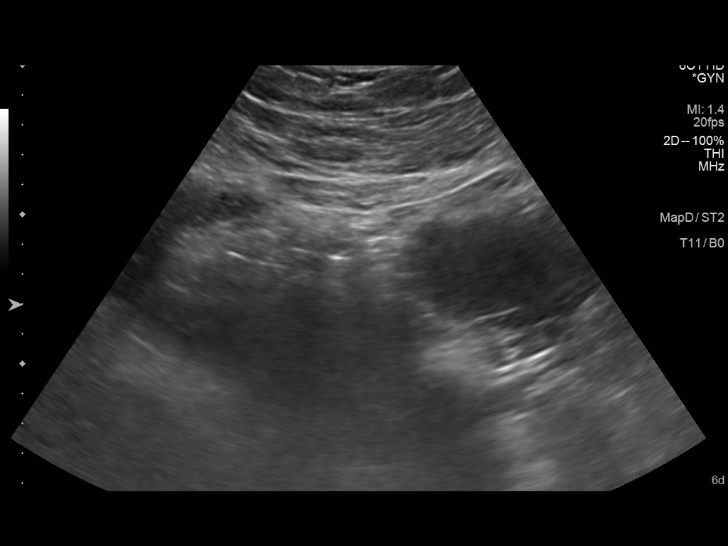
[im 101/110]
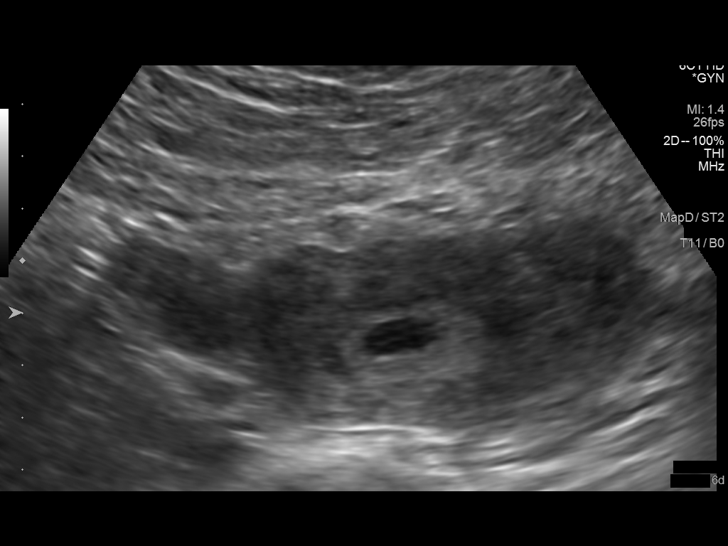
[im 110/110]
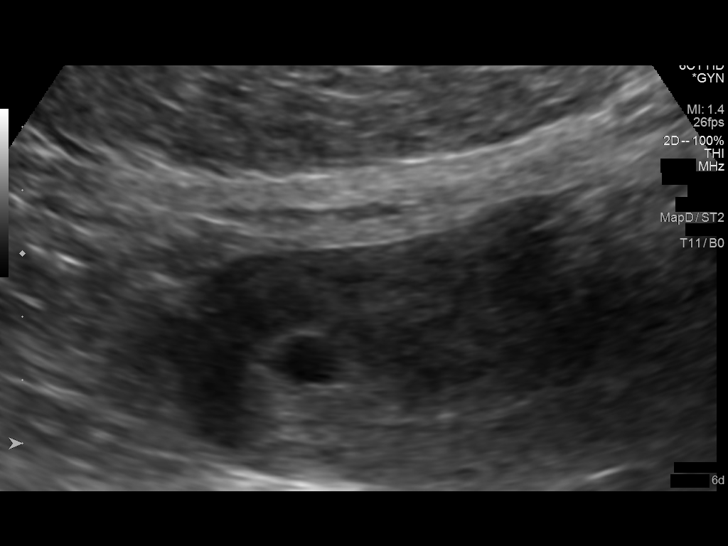

[14 of 28 positions shown; findings below may reference images not displayed]

FINDINGS: Intrauterine gestational sac: Visualized/normal in shape.

Yolk sac:  Not visualized

Embryo:  Not visualized

Cardiac Activity: Not visualized

Heart Rate: No embryo identified

MSD: 11  mm   5 w   6  d

Maternal uterus/adnexae: Ovaries are normal.  No free fluid.
IMPRESSION: Intrauterine gestational sac is identified, but no yolk sac, fetal
pole, or cardiac activity yet visualized. Recommend follow-up
quantitative B-HCG levels and follow-up US in 14 days to confirm and
assess viability. This recommendation follows SRU consensus
guidelines: Diagnostic Criteria for Nonviable Pregnancy Early in the
First Trimester. N Engl J Med 4900; [DATE].

## 2017-04-14 ENCOUNTER — Telehealth (HOSPITAL_COMMUNITY): Payer: Self-pay

## 2017-04-14 MED ORDER — ALPRAZOLAM 0.5 MG PO TABS: 0.5000 mg | ORAL_TABLET | Freq: Two times a day (BID) | ORAL | 0 refills | Status: DC | PRN

## 2017-04-14 NOTE — Telephone Encounter (Signed)
Patient called for refill on Alprazolam. Last visit was on 02/07/17, next office visit is on 04/24/17. Please review and advise.

## 2017-04-14 NOTE — Telephone Encounter (Signed)
Can refill. 

## 2017-04-14 NOTE — Telephone Encounter (Signed)
Printed Alprazolam rx for Dr. Gilmore LarocheAkhtar to sign. Informed patient that rx is at the front desk ready for pick up. Nothing further is needed at this time.

## 2017-04-24 ENCOUNTER — Other Ambulatory Visit: Payer: Self-pay

## 2017-04-24 ENCOUNTER — Encounter (HOSPITAL_COMMUNITY): Payer: Self-pay | Admitting: Psychiatry

## 2017-04-24 ENCOUNTER — Ambulatory Visit (INDEPENDENT_AMBULATORY_CARE_PROVIDER_SITE_OTHER): Payer: Federal, State, Local not specified - PPO | Admitting: Psychiatry

## 2017-04-24 VITALS — BP 126/86 | HR 76 | Ht 67.0 in | Wt 173.0 lb

## 2017-04-24 DIAGNOSIS — Z811 Family history of alcohol abuse and dependence: Secondary | ICD-10-CM | POA: Diagnosis not present

## 2017-04-24 DIAGNOSIS — Z87891 Personal history of nicotine dependence: Secondary | ICD-10-CM | POA: Diagnosis not present

## 2017-04-24 DIAGNOSIS — Z818 Family history of other mental and behavioral disorders: Secondary | ICD-10-CM | POA: Diagnosis not present

## 2017-04-24 DIAGNOSIS — F331 Major depressive disorder, recurrent, moderate: Secondary | ICD-10-CM | POA: Diagnosis not present

## 2017-04-24 DIAGNOSIS — F4310 Post-traumatic stress disorder, unspecified: Secondary | ICD-10-CM

## 2017-04-24 DIAGNOSIS — F410 Panic disorder [episodic paroxysmal anxiety] without agoraphobia: Secondary | ICD-10-CM

## 2017-04-24 DIAGNOSIS — F900 Attention-deficit hyperactivity disorder, predominantly inattentive type: Secondary | ICD-10-CM

## 2017-04-24 MED ORDER — AMPHETAMINE-DEXTROAMPHETAMINE 15 MG PO TABS: 7.5000 mg | ORAL_TABLET | Freq: Every day | ORAL | 0 refills | Status: DC

## 2017-04-24 MED ORDER — CITALOPRAM HYDROBROMIDE 10 MG PO TABS: 15.0000 mg | ORAL_TABLET | Freq: Every day | ORAL | 1 refills | Status: DC

## 2017-04-24 NOTE — Progress Notes (Signed)
BH MD/PA/NP OP Progress Note  04/24/2017 8:38 AM Morgan Miller  MRN:  [id number]  Chief Complaint:  Chief Complaint    Follow-up; Other     Subjective:  Depression . Follow up  HPI: History of Present Illness:  Patient is a 37 years old [place] American descent married female referred initially  by primary care physician for anxiety depression and possible PTSD. Also been diagnosed with ADHD.  Patient is 8 months postpartum Her dad died in [place] 12-27-22. Step mom took over all stuff and not communicating   Husband somewhat quite but supportive  Aggravating factors; job stress, grief Post partum. Dad death Modifying factor : husband .  No psychosis     Visit Diagnosis:    ICD-10-CM   1. Moderate episode of recurrent major depressive disorder (HCC) F33.1   2. PTSD (post-traumatic stress disorder) F43.10   3. Attention deficit hyperactivity disorder (ADHD), predominantly inattentive type F90.0   4. Panic disorder F41.0     Past Psychiatric History: depression  Past Medical History:  Past Medical History:  Diagnosis Date  . Complication of anesthesia    epidural caused syncope  . Hypertension   . Preeclampsia in postpartum period     Past Surgical History:  Procedure Laterality Date  . CESAREAN SECTION       Family History:  Family History  Problem Relation Age of Onset  . Hyperlipidemia Mother   . Depression Mother   . Anxiety disorder Mother   . Diabetes Father   . Hyperlipidemia Father   . Hypertension Father   . Parkinson's disease Father   . Alcohol abuse Paternal Uncle   . Heart attack Paternal Grandfather     Social History:  Social History   Socioeconomic History  . Marital status: Married    Spouse name: None  . Number of children: None  . Years of education: None  . Highest education level: None  Social Needs  . Financial resource strain: None  . Food insecurity - worry: None  . Food insecurity - inability: None  . Transportation needs -  medical: None  . Transportation needs - non-medical: None  Occupational History  . Occupation: Merchandiser, retail  Tobacco Use  . Smoking status: Former Smoker    Packs/day: 0.30    Types: Cigarettes  . Smokeless tobacco: Never Used  Substance and Sexual Activity  . Alcohol use: No    Alcohol/week: 1.2 oz    Types: 2 Glasses of wine per week    Comment: Every couple of weeks  . Drug use: No  . Sexual activity: Yes    Partners: Male  Other Topics Concern  . None  Social History Narrative  . None    Allergies:  Allergies  Allergen Reactions  . Codeine Nausea Only  . Nifedipine     fatigue  . Penicillins   . Sulfamethoxazole Nausea And Vomiting    Metabolic Disorder Labs: Lab Results  Component Value Date   HGBA1C 5.7 (H) 07/14/2015   MPG 117 07/14/2015   MPG 126 (H) 07/25/2014   No results found for: PROLACTIN Lab Results  Component Value Date   CHOL 193 06/25/2013   TRIG 122 06/25/2013   HDL 48 06/25/2013   CHOLHDL 4.0 06/25/2013   VLDL 24 06/25/2013   LDLCALC 121 (H) 06/25/2013     Current Medications: Current Outpatient Medications  Medication Sig Dispense Refill  . ALPRAZolam (XANAX) 0.5 MG tablet Take 1 tablet (0.5 mg total) by mouth 2 (two) times  daily as needed for anxiety. 60 tablet 0  . amphetamine-dextroamphetamine (ADDERALL) 15 MG tablet Take 0.5 tablets by mouth daily. Take half to one divided dose a day 30 tablet 0  . citalopram (CELEXA) 10 MG tablet Take 1.5 tablets (15 mg total) by mouth daily. 45 tablet 1  . azithromycin (ZITHROMAX Z-PAK) 250 MG tablet Take 2 tablets (500 mg) on  Day 1,  followed by 1 tablet (250 mg) once daily on Days 2 through 5. (Patient not taking: Reported on 04/24/2017) 6 tablet 0  . fluticasone (FLONASE) 50 MCG/ACT nasal spray One spray in each nostril twice a day, use left hand for right nostril, and right hand for left nostril. (Patient not taking: Reported on 04/24/2017) 48 g 3  . predniSONE (DELTASONE) 50 MG tablet One tab PO  daily for 5 days. (Patient not taking: Reported on 04/24/2017) 5 tablet 0   No current facility-administered medications for this visit.       Psychiatric Specialty Exam: Review of Systems  Constitutional: Negative for fever.  Cardiovascular: Negative for palpitations.  Gastrointestinal: Negative for nausea.  Skin: Negative for rash.  Neurological: Negative for tremors.  Psychiatric/Behavioral: Positive for depression. Negative for suicidal ideas.    Blood pressure 126/86, pulse 76, height 5\' 7"  (1.702 m), weight 173 lb (78.5 kg), currently breastfeeding.Body mass index is 27.1 kg/m.  General Appearance: Casual  Eye Contact:  Fair  Speech:  Normal Rate  Volume:  Decreased  Mood: subdued  Affect:  constricted  Thought Process:  Goal Directed  Orientation:  Full (Time, Place, and Person)  Thought Content: clear  Suicidal Thoughts:  No  Homicidal Thoughts:  No  Memory:  Immediate;   Fair Recent;   Good  Judgement:  Fair  Insight:  Fair  Psychomotor Activity:  Normal  Concentration:  Concentration: Fair and Attention Span: Fair  Recall:  [employer]  Fund of Knowledge: Fair  Language: Fair  Akathisia:  Negative  Handed:  Right  AIMS (if indicated):    Assets:  Husband support  ADL's:  Intact  Cognition: WNL  Sleep:  Fair to variable     Treatment Plan Summary:Medication management and Plan as follows   Grief: ongoing. Continue celexa provided supportive therapy. Managing to work part time 1. ADHD: manageable on adderall. Continue 2. PTSD:baseline continue meds   3. Depression: postpartum 8 months. Baby doing good. Continue celexa. Keep busy with activities    Renewed refills. Questions addressed FU 2 months Thresa RossNadeem Petro Talent, MD 04/24/2017, 8:38 AM

## 2017-06-09 ENCOUNTER — Other Ambulatory Visit (HOSPITAL_COMMUNITY): Payer: Self-pay | Admitting: Psychiatry

## 2017-06-13 ENCOUNTER — Telehealth (HOSPITAL_COMMUNITY): Payer: Self-pay | Admitting: Psychiatry

## 2017-06-13 MED ORDER — ALPRAZOLAM 0.5 MG PO TABS: 0.5000 mg | ORAL_TABLET | Freq: Two times a day (BID) | ORAL | 0 refills | Status: DC | PRN

## 2017-06-13 NOTE — Telephone Encounter (Signed)
Pt needs refill on [facility]anax [facility] [address].

## 2017-06-13 NOTE — Telephone Encounter (Addendum)
Printed rx and called in to pharmacy.

## 2017-06-13 NOTE — Telephone Encounter (Signed)
Can send 

## 2017-06-17 DIAGNOSIS — K080 Exfoliation of teeth due to systemic causes: Secondary | ICD-10-CM | POA: Diagnosis not present

## 2017-06-30 DIAGNOSIS — K080 Exfoliation of teeth due to systemic causes: Secondary | ICD-10-CM | POA: Diagnosis not present

## 2017-07-25 ENCOUNTER — Other Ambulatory Visit (HOSPITAL_COMMUNITY): Payer: Self-pay | Admitting: Psychiatry

## 2017-07-25 ENCOUNTER — Telehealth (HOSPITAL_COMMUNITY): Payer: Self-pay

## 2017-07-25 MED ORDER — ALPRAZOLAM 0.5 MG PO TABS: 0.5000 mg | ORAL_TABLET | Freq: Two times a day (BID) | ORAL | 0 refills | Status: DC | PRN

## 2017-07-25 MED ORDER — AMPHETAMINE-DEXTROAMPHETAMINE 15 MG PO TABS: 7.5000 mg | ORAL_TABLET | Freq: Every day | ORAL | 0 refills | Status: DC

## 2017-07-25 NOTE — Telephone Encounter (Signed)
Informed patient that she can come pick up printed scripts. She also stated she will call next Tuesday to make an appointment. Nothing further is needed at this time.

## 2017-07-25 NOTE — Telephone Encounter (Signed)
Patient called to request refill on Adderall and Xanax. Last seen on 04/24/17, and was supposed to follow up in 2 months. No future appointment scheduled. Please review and advise.

## 2017-07-25 NOTE — Telephone Encounter (Signed)
Ask to make appointment within a month. Can send refill if she is doing fair and makes appointment before next refill due. I can print adderall for pick up

## 2017-08-18 ENCOUNTER — Encounter (HOSPITAL_COMMUNITY): Payer: Self-pay | Admitting: Psychiatry

## 2017-08-18 ENCOUNTER — Ambulatory Visit (INDEPENDENT_AMBULATORY_CARE_PROVIDER_SITE_OTHER): Payer: Federal, State, Local not specified - PPO | Admitting: Psychiatry

## 2017-08-18 VITALS — BP 112/76 | HR 94 | Ht 67.0 in | Wt 174.0 lb

## 2017-08-18 DIAGNOSIS — Z818 Family history of other mental and behavioral disorders: Secondary | ICD-10-CM | POA: Diagnosis not present

## 2017-08-18 DIAGNOSIS — Z87891 Personal history of nicotine dependence: Secondary | ICD-10-CM | POA: Diagnosis not present

## 2017-08-18 DIAGNOSIS — F900 Attention-deficit hyperactivity disorder, predominantly inattentive type: Secondary | ICD-10-CM | POA: Diagnosis not present

## 2017-08-18 DIAGNOSIS — Z811 Family history of alcohol abuse and dependence: Secondary | ICD-10-CM

## 2017-08-18 DIAGNOSIS — F4310 Post-traumatic stress disorder, unspecified: Secondary | ICD-10-CM | POA: Diagnosis not present

## 2017-08-18 DIAGNOSIS — F410 Panic disorder [episodic paroxysmal anxiety] without agoraphobia: Secondary | ICD-10-CM | POA: Diagnosis not present

## 2017-08-18 DIAGNOSIS — F331 Major depressive disorder, recurrent, moderate: Secondary | ICD-10-CM | POA: Diagnosis not present

## 2017-08-18 DIAGNOSIS — K080 Exfoliation of teeth due to systemic causes: Secondary | ICD-10-CM | POA: Diagnosis not present

## 2017-08-18 MED ORDER — AMPHETAMINE-DEXTROAMPHETAMINE 15 MG PO TABS: 7.5000 mg | ORAL_TABLET | Freq: Every day | ORAL | 0 refills | Status: DC

## 2017-08-18 MED ORDER — CITALOPRAM HYDROBROMIDE 10 MG PO TABS: 15.0000 mg | ORAL_TABLET | Freq: Every day | ORAL | 1 refills | Status: DC

## 2017-08-18 MED ORDER — ALPRAZOLAM 0.5 MG PO TABS: 0.5000 mg | ORAL_TABLET | Freq: Two times a day (BID) | ORAL | 0 refills | Status: DC | PRN

## 2017-08-18 NOTE — Progress Notes (Signed)
BH MD/PA/NP OP Progress Note  08/18/2017 1:16 PM Morgan Miller  MRN:  [id number]  Chief Complaint:  Chief Complaint    Follow-up     Subjective:  Depression . Follow up  HPI: History of Present Illness:  Patient is a 37 years old [place] descent married female referred initially  by primary care physician for anxiety depression and possible PTSD. Also been diagnosed with ADHD.  Patient postpartum 12 months  doing fair  working and case is pended for 3 years Her dad died in [place] 12/23/2022. Step mom took over all stuff and not communicating   Relationship betting better   Aggravating factors; job stress, grief Post partum. Dad death Modifying factor : husband .  No psychosis     Visit Diagnosis:    ICD-10-CM   1. Moderate episode of recurrent major depressive disorder (HCC) F33.1   2. PTSD (post-traumatic stress disorder) F43.10   3. Attention deficit hyperactivity disorder (ADHD), predominantly inattentive type F90.0   4. Panic disorder F41.0     Past Psychiatric History: depression  Past Medical History:  Past Medical History:  Diagnosis Date  . Complication of anesthesia    epidural caused syncope  . Hypertension   . Preeclampsia in postpartum period     Past Surgical History:  Procedure Laterality Date  . CESAREAN SECTION       Family History:  Family History  Problem Relation Age of Onset  . Hyperlipidemia Mother   . Depression Mother   . Anxiety disorder Mother   . Diabetes Father   . Hyperlipidemia Father   . Hypertension Father   . Parkinson's disease Father   . Alcohol abuse Paternal Uncle   . Heart attack Paternal Grandfather     Social History:  Social History   Socioeconomic History  . Marital status: Married    Spouse name: Not on file  . Number of children: Not on file  . Years of education: Not on file  . Highest education level: Not on file  Occupational History  . Occupation: Merchandiser, retail  Social Needs  . Financial  resource strain: Not on file  . Food insecurity:    Worry: Not on file    Inability: Not on file  . Transportation needs:    Medical: Not on file    Non-medical: Not on file  Tobacco Use  . Smoking status: Former Smoker    Packs/day: 0.30    Types: Cigarettes  . Smokeless tobacco: Never Used  Substance and Sexual Activity  . Alcohol use: No    Alcohol/week: 1.2 oz    Types: 2 Glasses of wine per week    Comment: Every couple of weeks  . Drug use: No  . Sexual activity: Yes    Partners: Male  Lifestyle  . Physical activity:    Days per week: Not on file    Minutes per session: Not on file  . Stress: Not on file  Relationships  . Social connections:    Talks on phone: Not on file    Gets together: Not on file    Attends religious service: Not on file    Active member of club or organization: Not on file    Attends meetings of clubs or organizations: Not on file    Relationship status: Not on file  Other Topics Concern  . Not on file  Social History Narrative  . Not on file    Allergies:  Allergies  Allergen Reactions  . Codeine  Nausea Only  . Nifedipine     fatigue  . Penicillins   . Sulfamethoxazole Nausea And Vomiting    Metabolic Disorder Labs: Lab Results  Component Value Date   HGBA1C 5.7 (H) 07/14/2015   MPG 117 07/14/2015   MPG 126 (H) 07/25/2014   No results found for: PROLACTIN Lab Results  Component Value Date   CHOL 193 06/25/2013   TRIG 122 06/25/2013   HDL 48 06/25/2013   CHOLHDL 4.0 06/25/2013   VLDL 24 06/25/2013   LDLCALC 121 (H) 06/25/2013     Current Medications: Current Outpatient Medications  Medication Sig Dispense Refill  . ALPRAZolam (XANAX) 0.5 MG tablet Take 1 tablet (0.5 mg total) by mouth 2 (two) times daily as needed for anxiety. 60 tablet 0  . amphetamine-dextroamphetamine (ADDERALL) 15 MG tablet Take 0.5 tablets by mouth daily. Take half to one divided dose a day 30 tablet 0  . citalopram (CELEXA) 10 MG tablet Take  1.5 tablets (15 mg total) by mouth daily. 45 tablet 1  . fluticasone (FLONASE) 50 MCG/ACT nasal spray One spray in each nostril twice a day, use left hand for right nostril, and right hand for left nostril. 48 g 3  . Multiple Vitamin (MULTIVITAMIN) tablet Take 1 tablet by mouth daily.    Marland Kitchen azithromycin (ZITHROMAX Z-PAK) 250 MG tablet Take 2 tablets (500 mg) on  Day 1,  followed by 1 tablet (250 mg) once daily on Days 2 through 5. (Patient not taking: Reported on 08/18/2017) 6 tablet 0  . predniSONE (DELTASONE) 50 MG tablet One tab PO daily for 5 days. (Patient not taking: Reported on 08/18/2017) 5 tablet 0   No current facility-administered medications for this visit.       Psychiatric Specialty Exam: Review of Systems  Constitutional: Negative for fever.  Cardiovascular: Negative for chest pain.  Gastrointestinal: Negative for nausea.  Skin: Negative for rash.  Neurological: Negative for tremors.  Psychiatric/Behavioral: Negative for suicidal ideas.    Blood pressure 112/76, pulse 94, height  (1.702 m), weight 174 lb (78.9 kg), SpO2 98 %, currently breastfeeding.Body mass index is 27.25 kg/m.  General Appearance: Casual  Eye Contact:  Fair  Speech:  Normal Rate  Volume:  Decreased  Mood: fair  Affect:  constricted  Thought Process:  Goal Directed  Orientation:  Full (Time, Place, and Person)  Thought Content: clear  Suicidal Thoughts:  No  Homicidal Thoughts:  No  Memory:  Immediate;   Fair Recent;   Good  Judgement:  Fair  Insight:  Fair  Psychomotor Activity:  Normal  Concentration:  Concentration: Fair and Attention Span: Fair  Recall:  [employer] of Knowledge: Fair  Language: Fair  Akathisia:  Negative  Handed:  Right  AIMS (if indicated):    Assets:  Husband support  ADL's:  Intact  Cognition: WNL  Sleep:  Fair to variable     Treatment Plan Summary:Medication management and Plan as follows   Grief: better. Continue lexapro 1. ADHD:manageable on  adderall.continue low dose 2. PTSD:baseline continue meds including small dose xanax. Avoid daily use if possible    3. Depression: postpartum 8 months. Baby doing good. Continue celexa. Keep busy with activities    Renewed refills. Questions addressed fu 3-4 m Thresa Ross, MD 08/18/2017, 1:16 PM

## 2017-09-18 ENCOUNTER — Telehealth (HOSPITAL_COMMUNITY): Payer: Self-pay

## 2017-09-18 MED ORDER — CITALOPRAM HYDROBROMIDE 10 MG PO TABS: 15.0000 mg | ORAL_TABLET | Freq: Every day | ORAL | 0 refills | Status: DC

## 2017-09-18 MED ORDER — ALPRAZOLAM 0.5 MG PO TABS: 0.5000 mg | ORAL_TABLET | Freq: Two times a day (BID) | ORAL | 0 refills | Status: DC | PRN

## 2017-09-18 MED ORDER — AMPHETAMINE-DEXTROAMPHETAMINE 15 MG PO TABS: 7.5000 mg | ORAL_TABLET | Freq: Every day | ORAL | 0 refills | Status: DC

## 2017-09-18 NOTE — Telephone Encounter (Signed)
Left vm informing patient of medications sent to the pharmacy.

## 2017-09-18 NOTE — Telephone Encounter (Signed)
Patient called requesting a refill on Adderall be sent to [facility] [place]. Also refills on [facility]prazolam and [facility] [address]

## 2017-09-18 NOTE — Telephone Encounter (Signed)
sent 

## 2017-10-27 ENCOUNTER — Telehealth (HOSPITAL_COMMUNITY): Payer: Self-pay

## 2017-10-27 NOTE — Telephone Encounter (Signed)
Patient called requesting a refill for Xanax and Adderall. She would like [facility]all sent to [facility] [place] and [facility]. Gilmore LarocheAkhtar sent it the last time. Patient understands Dr. Vara GuardianAlhtar is out of office until Wednesday.

## 2017-10-29 MED ORDER — ALPRAZOLAM 0.5 MG PO TABS: 0.5000 mg | ORAL_TABLET | Freq: Two times a day (BID) | ORAL | 0 refills | Status: DC | PRN

## 2017-10-29 MED ORDER — AMPHETAMINE-DEXTROAMPHETAMINE 15 MG PO TABS: 7.5000 mg | ORAL_TABLET | Freq: Every day | ORAL | 0 refills | Status: DC

## 2017-10-29 NOTE — Telephone Encounter (Signed)
Called and informed patient that medication was sent to pharmacies.

## 2017-10-29 NOTE — Telephone Encounter (Signed)
sent 

## 2017-11-23 ENCOUNTER — Other Ambulatory Visit (HOSPITAL_COMMUNITY): Payer: Self-pay | Admitting: Psychiatry

## 2017-12-02 ENCOUNTER — Telehealth (HOSPITAL_COMMUNITY): Payer: Self-pay

## 2017-12-02 NOTE — Telephone Encounter (Signed)
Patient needs refill on Adderall and Xanax. They both go to different pharmacies.  [facility]

## 2017-12-03 NOTE — Telephone Encounter (Signed)
Patient states that she will wait until Dr. Gilmore LarocheAkhtar can send the rx thru electronically.

## 2017-12-05 MED ORDER — ALPRAZOLAM 0.5 MG PO TABS: 0.5000 mg | ORAL_TABLET | Freq: Two times a day (BID) | ORAL | 0 refills | Status: DC | PRN

## 2017-12-05 MED ORDER — AMPHETAMINE-DEXTROAMPHETAMINE 15 MG PO TABS: 7.5000 mg | ORAL_TABLET | Freq: Every day | ORAL | 0 refills | Status: DC

## 2017-12-05 NOTE — Telephone Encounter (Signed)
I am printing out for pickup for now.

## 2017-12-05 NOTE — Telephone Encounter (Signed)
Called and left vm informing patient that she needs to pick up rx since Dr. Gilmore LarocheAkhtar is still having problems sending it thru to pharmacy.

## 2017-12-11 ENCOUNTER — Telehealth (HOSPITAL_COMMUNITY): Payer: Self-pay

## 2017-12-11 NOTE — Telephone Encounter (Signed)
Patient had called requesting for prescription to be resubmitted to pharmacy, spoke with Dr. Gilmore Laroche unable to submitted scripts, but they are printed for patient at the front desk.     Called patient 2 X's unable to leave voicemail.

## 2018-06-05 DIAGNOSIS — K080 Exfoliation of teeth due to systemic causes: Secondary | ICD-10-CM | POA: Diagnosis not present

## 2018-06-08 ENCOUNTER — Ambulatory Visit (INDEPENDENT_AMBULATORY_CARE_PROVIDER_SITE_OTHER): Payer: Federal, State, Local not specified - PPO | Admitting: Psychiatry

## 2018-06-08 ENCOUNTER — Encounter (HOSPITAL_COMMUNITY): Payer: Self-pay | Admitting: Psychiatry

## 2018-06-08 ENCOUNTER — Other Ambulatory Visit: Payer: Self-pay

## 2018-06-08 VITALS — BP 130/82 | HR 86 | Ht 67.0 in | Wt 195.0 lb

## 2018-06-08 DIAGNOSIS — F900 Attention-deficit hyperactivity disorder, predominantly inattentive type: Secondary | ICD-10-CM

## 2018-06-08 DIAGNOSIS — F331 Major depressive disorder, recurrent, moderate: Secondary | ICD-10-CM | POA: Diagnosis not present

## 2018-06-08 DIAGNOSIS — F410 Panic disorder [episodic paroxysmal anxiety] without agoraphobia: Secondary | ICD-10-CM | POA: Diagnosis not present

## 2018-06-08 DIAGNOSIS — F4310 Post-traumatic stress disorder, unspecified: Secondary | ICD-10-CM

## 2018-06-08 MED ORDER — CITALOPRAM HYDROBROMIDE 10 MG PO TABS: ORAL_TABLET | ORAL | 0 refills | Status: DC

## 2018-06-08 MED ORDER — AMPHETAMINE-DEXTROAMPHETAMINE 10 MG PO TABS: 10.0000 mg | ORAL_TABLET | Freq: Every day | ORAL | 0 refills | Status: DC

## 2018-06-08 NOTE — Progress Notes (Signed)
BH MD/PA/NP OP Progress Note  06/08/2018 11:29 AM Elonda Krut  MRN:  [id number]  Chief Complaint:  Chief Complaint    Follow-up; Other     Subjective:  Depression . Follow up  HPI: History of Present Illness:  Patient is a 38 years old [place] American descent married female referred initially  by primary care physician for anxiety depression and possible PTSD. Also been diagnosed with ADHD.  Patient postpartum near 2 years  returns after 10 months  Apparently has been taking lower medication dosages.  She still having concern about depression at times more subdued but relationship is fine or getting better Legal case still pending Gets distracted without adderall  Aggravating factors; job stress, grief Post partum. Dad death Modifying factor : husband.  No psychosis Duration more then 5years    Visit Diagnosis:  No diagnosis found.  Past Psychiatric History: depression  Past Medical History:  Past Medical History:  Diagnosis Date  . Complication of anesthesia    epidural caused syncope  . Hypertension   . Preeclampsia in postpartum period     Past Surgical History:  Procedure Laterality Date  . CESAREAN SECTION       Family History:  Family History  Problem Relation Age of Onset  . Hyperlipidemia Mother   . Depression Mother   . Anxiety disorder Mother   . Diabetes Father   . Hyperlipidemia Father   . Hypertension Father   . Parkinson's disease Father   . Alcohol abuse Paternal Uncle   . Heart attack Paternal Grandfather     Social History:  Social History   Socioeconomic History  . Marital status: Married    Spouse name: Not on file  . Number of children: Not on file  . Years of education: Not on file  . Highest education level: Not on file  Occupational History  . Occupation: Merchandiser, retail  Social Needs  . Financial resource strain: Not on file  . Food insecurity:    Worry: Not on file    Inability: Not on file  . Transportation needs:   Medical: Not on file    Non-medical: Not on file  Tobacco Use  . Smoking status: Current Some Day Smoker    Packs/day: 0.30    Types: Cigarettes  . Smokeless tobacco: Never Used  Substance and Sexual Activity  . Alcohol use: No    Alcohol/week: 2.0 standard drinks    Types: 2 Glasses of wine per week    Comment: Every couple of weeks  . Drug use: No  . Sexual activity: Yes    Partners: Male  Lifestyle  . Physical activity:    Days per week: Not on file    Minutes per session: Not on file  . Stress: Not on file  Relationships  . Social connections:    Talks on phone: Not on file    Gets together: Not on file    Attends religious service: Not on file    Active member of club or organization: Not on file    Attends meetings of clubs or organizations: Not on file    Relationship status: Not on file  Other Topics Concern  . Not on file  Social History Narrative  . Not on file    Allergies:  Allergies  Allergen Reactions  . Codeine Nausea Only  . Nifedipine     fatigue  . Penicillins   . Sulfamethoxazole Nausea And Vomiting    Metabolic Disorder Labs: Lab Results  Component  Value Date   HGBA1C 5.7 (H) 07/14/2015   MPG 117 07/14/2015   MPG 126 (H) 07/25/2014   No results found for: PROLACTIN Lab Results  Component Value Date   CHOL 193 06/25/2013   TRIG 122 06/25/2013   HDL 48 06/25/2013   CHOLHDL 4.0 06/25/2013   VLDL 24 06/25/2013   LDLCALC 121 (H) 06/25/2013     Current Medications: Current Outpatient Medications  Medication Sig Dispense Refill  . ALPRAZolam (XANAX) 0.5 MG tablet Take 1 tablet (0.5 mg total) by mouth 2 (two) times daily as needed for anxiety. 60 tablet 0  . fluticasone (FLONASE) 50 MCG/ACT nasal spray One spray in each nostril twice a day, use left hand for right nostril, and right hand for left nostril. 48 g 3  . Multiple Vitamin (MULTIVITAMIN) tablet Take 1 tablet by mouth daily.    Marland Kitchen amphetamine-dextroamphetamine (ADDERALL) 10 MG  tablet Take 1 tablet (10 mg total) by mouth daily. 30 tablet 0  . amphetamine-dextroamphetamine (ADDERALL) 15 MG tablet Take 0.5 tablets by mouth daily. Take half to one divided dose a day (Patient not taking: Reported on 06/08/2018) 30 tablet 0  . azithromycin (ZITHROMAX Z-PAK) 250 MG tablet Take 2 tablets (500 mg) on  Day 1,  followed by 1 tablet (250 mg) once daily on Days 2 through 5. (Patient not taking: Reported on 08/18/2017) 6 tablet 0  . citalopram (CELEXA) 10 MG tablet TAKE 1 AND 1/2 TABLETS DAILY BY MOUTH 45 tablet 0  . predniSONE (DELTASONE) 50 MG tablet One tab PO daily for 5 days. (Patient not taking: Reported on 08/18/2017) 5 tablet 0   No current facility-administered medications for this visit.       Psychiatric Specialty Exam: Review of Systems  Constitutional: Negative for fever.  Cardiovascular: Negative for palpitations.  Gastrointestinal: Negative for nausea.  Skin: Negative for rash.  Neurological: Negative for tremors.  Psychiatric/Behavioral: Negative for suicidal ideas.    Blood pressure 130/82, pulse 86, height 5\' 7"  (1.702 m), weight 195 lb (88.5 kg), currently breastfeeding.Body mass index is 30.54 kg/m.  General Appearance: Casual  Eye Contact:  Fair  Speech:  Normal Rate  Volume:  Decreased  Mood: fair  Affect:  constricted  Thought Process:  Goal Directed  Orientation:  Full (Time, Place, and Person)  Thought Content: clear  Suicidal Thoughts:  No  Homicidal Thoughts:  No  Memory:  Immediate;   Fair Recent;   Good  Judgement:  Fair  Insight:  Fair  Psychomotor Activity:  Normal  Concentration:  Concentration: Fair and Attention Span: Fair  Recall:  [employer] of Knowledge: Fair  Language: Fair  Akathisia:  Negative  Handed:  Right  AIMS (if indicated):    Assets:  Husband support  ADL's:  Intact  Cognition: WNL  Sleep:  Fair to variable     Treatment Plan Summary:Medication management and Plan as follows   Grief: better. Continue  lexapro 1. ADHD: gets distracted without meds, takes low dose, will re instate 10mg  take half or one a day 2. PTSD:baseline, concern related to case not moving , restart celexa for anxiety  3. Depression: subdued, restart celexa,  Discussed compliance and to follow regularly Brought paperwork for FMLA intermittend days off needed for mental health   Will keep xanax on hold and re start celexa Renewed refills. Questions addressed fu 3-4 m Thresa Ross, MD 06/08/2018, 11:29 AM

## 2018-06-09 DIAGNOSIS — K080 Exfoliation of teeth due to systemic causes: Secondary | ICD-10-CM | POA: Diagnosis not present

## 2018-06-14 DIAGNOSIS — T1511XA Foreign body in conjunctival sac, right eye, initial encounter: Secondary | ICD-10-CM | POA: Diagnosis not present

## 2018-08-25 ENCOUNTER — Telehealth (HOSPITAL_COMMUNITY): Payer: Self-pay | Admitting: Psychiatry

## 2018-08-25 MED ORDER — AMPHETAMINE-DEXTROAMPHETAMINE 10 MG PO TABS: 10.0000 mg | ORAL_TABLET | Freq: Every day | ORAL | 0 refills | Status: DC

## 2018-08-25 NOTE — Telephone Encounter (Signed)
Pt needs refill on  adderall sent to [facility] [place]  And xanax sent to [facility] on [address]

## 2018-08-25 NOTE — Telephone Encounter (Signed)
adderall sent.  Xanax not prescribed for a long time has been on hold. Was given celexa last visit instead.

## 2018-08-25 NOTE — Telephone Encounter (Signed)
Informed patient of what Dr. Akhtar stated in previous message. She stated her understanding 

## 2018-10-21 DIAGNOSIS — L918 Other hypertrophic disorders of the skin: Secondary | ICD-10-CM | POA: Diagnosis not present

## 2018-11-17 ENCOUNTER — Telehealth (HOSPITAL_COMMUNITY): Payer: Self-pay

## 2018-11-17 MED ORDER — AMPHETAMINE-DEXTROAMPHETAMINE 10 MG PO TABS: 10.0000 mg | ORAL_TABLET | Freq: Every day | ORAL | 0 refills | Status: DC

## 2018-11-17 NOTE — Telephone Encounter (Signed)
Patient last seen on 06/08/18. She is requesting a refill on Adderall. [facility] in [place]

## 2018-11-17 NOTE — Telephone Encounter (Signed)
sent 

## 2018-12-08 DIAGNOSIS — K080 Exfoliation of teeth due to systemic causes: Secondary | ICD-10-CM | POA: Diagnosis not present

## 2019-01-01 ENCOUNTER — Telehealth (HOSPITAL_COMMUNITY): Payer: Self-pay

## 2019-01-01 MED ORDER — AMPHETAMINE-DEXTROAMPHETAMINE 10 MG PO TABS: 10.0000 mg | ORAL_TABLET | Freq: Every day | ORAL | 0 refills | Status: DC

## 2019-01-01 NOTE — Telephone Encounter (Signed)
Patient called requesting a refill on her Adderall 10mg  sent to [facility] on [address] in [place]. Thank you.

## 2019-01-01 NOTE — Telephone Encounter (Signed)
I just sent

## 2019-01-05 NOTE — Telephone Encounter (Signed)
Had notified patient 

## 2019-03-15 ENCOUNTER — Encounter (HOSPITAL_COMMUNITY): Payer: Self-pay | Admitting: Psychiatry

## 2019-03-15 ENCOUNTER — Ambulatory Visit (INDEPENDENT_AMBULATORY_CARE_PROVIDER_SITE_OTHER): Payer: Federal, State, Local not specified - PPO | Admitting: Psychiatry

## 2019-03-15 DIAGNOSIS — F4310 Post-traumatic stress disorder, unspecified: Secondary | ICD-10-CM | POA: Diagnosis not present

## 2019-03-15 DIAGNOSIS — F909 Attention-deficit hyperactivity disorder, unspecified type: Secondary | ICD-10-CM

## 2019-03-15 DIAGNOSIS — F331 Major depressive disorder, recurrent, moderate: Secondary | ICD-10-CM

## 2019-03-15 DIAGNOSIS — F339 Major depressive disorder, recurrent, unspecified: Secondary | ICD-10-CM | POA: Diagnosis not present

## 2019-03-15 DIAGNOSIS — F900 Attention-deficit hyperactivity disorder, predominantly inattentive type: Secondary | ICD-10-CM

## 2019-03-15 DIAGNOSIS — Z653 Problems related to other legal circumstances: Secondary | ICD-10-CM

## 2019-03-15 DIAGNOSIS — F419 Anxiety disorder, unspecified: Secondary | ICD-10-CM

## 2019-03-15 DIAGNOSIS — F410 Panic disorder [episodic paroxysmal anxiety] without agoraphobia: Secondary | ICD-10-CM

## 2019-03-15 DIAGNOSIS — Z79899 Other long term (current) drug therapy: Secondary | ICD-10-CM

## 2019-03-15 DIAGNOSIS — F17210 Nicotine dependence, cigarettes, uncomplicated: Secondary | ICD-10-CM

## 2019-03-15 MED ORDER — AMPHETAMINE-DEXTROAMPHETAMINE 10 MG PO TABS: 10.0000 mg | ORAL_TABLET | Freq: Every day | ORAL | 0 refills | Status: DC

## 2019-03-15 MED ORDER — CITALOPRAM HYDROBROMIDE 10 MG PO TABS: ORAL_TABLET | ORAL | 3 refills | Status: DC

## 2019-03-15 NOTE — Progress Notes (Signed)
BH MD/PA/NP OP Progress Note  03/15/2019 3:12 PM Morgan Miller  MRN:  [id number]  Chief Complaint:  depression follow up   I connected with Morgan Miller on 03/15/19 at  3:00 PM EST by telephone and verified that I am speaking with the correct person using two identifiers.   I discussed the limitations, risks, security and privacy concerns of performing an evaluation and management service by telephone and the availability of in person appointments. I also discussed with the patient that there may be a patient responsible charge related to this service. The patient expressed understanding and agreed to proceed.  HPI: History of Present Illness:  Patient is a 38 years old [place] American descent married female referred initially  by primary care physician for anxiety depression and possible PTSD. Also been diagnosed with ADHD.  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 Moderate episode of recurrent major depressive disorder (HCC)  F33.1   2. PTSD (post-traumatic stress disorder)  F43.10   3. Attention deficit hyperactivity disorder (ADHD), predominantly inattentive type  F90.0   4. Panic disorder  F41.0     Past Psychiatric History: depression  Past Medical History:  Past Medical History:  Diagnosis Date  . Complication of anesthesia    epidural caused syncope  . Hypertension   . Preeclampsia in postpartum period     Past Surgical History:  Procedure Laterality Date  . CESAREAN SECTION       Family History:  Family History  Problem Relation Age of Onset  . Hyperlipidemia Mother   . Depression Mother   . Anxiety disorder Mother   . Diabetes Father    . Hyperlipidemia Father   . Hypertension Father   . Parkinson's disease Father   . Alcohol abuse Paternal Uncle   . Heart attack Paternal Grandfather     Social History:  Social History   Socioeconomic History  . Marital status: Married    Spouse name: Not on file  . Number of children: Not on file  . Years of education: Not on file  . Highest education level: Not on file  Occupational History  . Occupation: Merchandiser, retailsupervisor  Social Needs  . Financial resource strain: Not on file  . Food insecurity    Worry: Not on file    Inability: Not on file  . Transportation needs    Medical: Not on file    Non-medical: Not on file  Tobacco Use  . Smoking status: Current Some Day Smoker    Packs/day: 0.30    Types: Cigarettes  . Smokeless tobacco: Never Used  Substance and Sexual Activity  . Alcohol use: No    Alcohol/week: 2.0 standard drinks    Types: 2 Glasses of wine per week    Comment: Every couple of weeks  . Drug use: No  . Sexual activity: Yes    Partners: Male  Lifestyle  . Physical activity    Days per week: Not on file    Minutes per session: Not on file  . Stress: Not on file  Relationships  . Social Musicianconnections    Talks on phone: Not on file    Gets  together: Not on file    Attends religious service: Not on file    Active member of club or organization: Not on file    Attends meetings of clubs or organizations: Not on file    Relationship status: Not on file  Other Topics Concern  . Not on file  Social History Narrative  . Not on file    Allergies:  Allergies  Allergen Reactions  . Codeine Nausea Only  . Nifedipine     fatigue  . Penicillins   . Sulfamethoxazole Nausea And Vomiting    Metabolic Disorder Labs: Lab Results  Component Value Date   HGBA1C 5.7 (H) 07/14/2015   MPG 117 07/14/2015   MPG 126 (H) 07/25/2014   No results found for: PROLACTIN Lab Results  Component Value Date   CHOL 193 06/25/2013   TRIG 122 06/25/2013   HDL 48  06/25/2013   CHOLHDL 4.0 06/25/2013   VLDL 24 06/25/2013   LDLCALC 121 (H) 06/25/2013     Current Medications: Current Outpatient Medications  Medication Sig Dispense Refill  . ALPRAZolam (XANAX) 0.5 MG tablet Take 1 tablet (0.5 mg total) by mouth 2 (two) times daily as needed for anxiety. 60 tablet 0  . amphetamine-dextroamphetamine (ADDERALL) 10 MG tablet Take 1 tablet (10 mg total) by mouth daily. 30 tablet 0  . azithromycin (ZITHROMAX Z-PAK) 250 MG tablet Take 2 tablets (500 mg) on  Day 1,  followed by 1 tablet (250 mg) once daily on Days 2 through 5. (Patient not taking: Reported on 08/18/2017) 6 tablet 0  . citalopram (CELEXA) 10 MG tablet TAKE 1 AND 1/2 TABLETS DAILY BY MOUTH 45 tablet 3  . fluticasone (FLONASE) 50 MCG/ACT nasal spray One spray in each nostril twice a day, use left hand for right nostril, and right hand for left nostril. 48 g 3  . Multiple Vitamin (MULTIVITAMIN) tablet Take 1 tablet by mouth daily.    . predniSONE (DELTASONE) 50 MG tablet One tab PO daily for 5 days. (Patient not taking: Reported on 08/18/2017) 5 tablet 0   No current facility-administered medications for this visit.       Psychiatric Specialty Exam: Review of Systems  Cardiovascular: Negative for palpitations.  Psychiatric/Behavioral: Negative for suicidal ideas.    currently breastfeeding.There is no height or weight on file to calculate BMI.  General Appearance:   Eye Contact:  Fair  Speech:  Normal Rate  Volume:  Decreased  Mood: fair  Affect:  constricted  Thought Process:  Goal Directed  Orientation:  Full (Time, Place, and Person)  Thought Content: clear  Suicidal Thoughts:  No  Homicidal Thoughts:  No  Memory:  Immediate;   Fair Recent;   Good  Judgement:  Fair  Insight:  Fair  Psychomotor Activity:  Normal  Concentration:  Concentration: Fair and Attention Span: Fair  Recall:  [employer] of Knowledge: Fair  Language: Fair  Akathisia:  Negative  Handed:  Right  AIMS (if  indicated):    Assets:  Husband support  ADL's:  Intact  Cognition: WNL  Sleep:  Fair to variable     Treatment Plan Summary:Medication management and Plan as follows   Grief: better. Continue lexapro 1. ADHD: gets distracted without meds, continue 10mg  or half a day 2. PTSD:baseline,not worse. Continue celexa 3. Depression:manageable. Continue celexa Discussed compliance and to follow regularly    I discussed the assessment and treatment plan with the patient. The patient was provided an opportunity to ask questions and  all were answered. The patient agreed with the plan and demonstrated an understanding of the instructions.   The patient was advised to call back or seek an in-person evaluation if the symptoms worsen or if the condition fails to improve as anticipated.  Renewed refills. Questions addressed fu 3-4 m Thresa Ross, MD 03/15/2019, 3:12 PM

## 2019-06-14 ENCOUNTER — Telehealth (HOSPITAL_COMMUNITY): Payer: Self-pay

## 2019-06-14 MED ORDER — AMPHETAMINE-DEXTROAMPHETAMINE 10 MG PO TABS: 10.0000 mg | ORAL_TABLET | Freq: Every day | ORAL | 0 refills | Status: DC

## 2019-06-14 NOTE — Telephone Encounter (Signed)
Patient called requesting a refill on Adderall. [facility] in [place]

## 2019-06-14 NOTE — Telephone Encounter (Signed)
sent 

## 2019-07-20 ENCOUNTER — Other Ambulatory Visit (HOSPITAL_COMMUNITY): Payer: Self-pay | Admitting: Psychiatry

## 2019-07-20 MED ORDER — AMPHETAMINE-DEXTROAMPHETAMINE 10 MG PO TABS: 10.0000 mg | ORAL_TABLET | Freq: Every day | ORAL | 0 refills | Status: DC

## 2019-07-20 NOTE — Telephone Encounter (Signed)
Pt needs refill on adderall [facility] [place]

## 2019-07-20 NOTE — Telephone Encounter (Signed)
sent 

## 2019-08-25 ENCOUNTER — Telehealth (HOSPITAL_COMMUNITY): Payer: Self-pay | Admitting: Psychiatry

## 2019-08-25 MED ORDER — AMPHETAMINE-DEXTROAMPHETAMINE 10 MG PO TABS: 10.0000 mg | ORAL_TABLET | Freq: Every day | ORAL | 0 refills | Status: DC

## 2019-08-25 NOTE — Telephone Encounter (Signed)
Pt needs refill on adderall [facility] [place]  

## 2019-08-25 NOTE — Telephone Encounter (Signed)
Sent but she needs appointment in June before next refill

## 2019-08-30 ENCOUNTER — Telehealth (INDEPENDENT_AMBULATORY_CARE_PROVIDER_SITE_OTHER): Payer: Federal, State, Local not specified - PPO | Admitting: Psychiatry

## 2019-08-30 ENCOUNTER — Encounter (HOSPITAL_COMMUNITY): Payer: Self-pay | Admitting: Psychiatry

## 2019-08-30 DIAGNOSIS — F331 Major depressive disorder, recurrent, moderate: Secondary | ICD-10-CM | POA: Diagnosis not present

## 2019-08-30 DIAGNOSIS — F4310 Post-traumatic stress disorder, unspecified: Secondary | ICD-10-CM

## 2019-08-30 DIAGNOSIS — F900 Attention-deficit hyperactivity disorder, predominantly inattentive type: Secondary | ICD-10-CM

## 2019-08-30 MED ORDER — CITALOPRAM HYDROBROMIDE 10 MG PO TABS: ORAL_TABLET | ORAL | 3 refills | Status: DC

## 2019-08-30 NOTE — Progress Notes (Signed)
BH MD/PA/NP OP Progress Note  08/30/2019 1:26 PM Morgan Miller  MRN:  [id number]  Chief Complaint:  depression follow up     I connected with Morgan Miller on 08/30/19 at  1:15 PM EDT by telephone and verified that I am speaking with the correct person using two identifiers.    I discussed the limitations, risks, security and privacy concerns of performing an evaluation and management service by telephone and the availability of in person appointments. I also discussed with the patient that there may be a patient responsible charge related to this service. The patient expressed understanding and agreed to proceed.  HPI: History of Present Illness:  Patient is a 39 years old [place] descent married female referred initially  by primary care physician for anxiety depression and possible PTSD. Also been diagnosed with ADHD.  Doing fair, tolerating meds Still on non Pay [employer] leave but may change to paid leave     Legal case still pending Gets distracted without adderall  Aggravating factors; past job concern Modifying factor : husband No psychosis Duration: since around 2015    Visit Diagnosis:    ICD-10-CM   1. Moderate episode of recurrent major depressive disorder (HCC)  F33.1   2. PTSD (post-traumatic stress disorder)  F43.10   3. Attention deficit hyperactivity disorder (ADHD), predominantly inattentive type  F90.0     Past Psychiatric History: depression  Past Medical History:  Past Medical History:  Diagnosis Date  . Complication of anesthesia    epidural caused syncope  . Hypertension   . Preeclampsia in postpartum period     Past Surgical History:  Procedure Laterality Date  . CESAREAN SECTION       Family History:  Family History  Problem Relation Age of Onset  . Hyperlipidemia Mother   . Depression Mother   . Anxiety disorder Mother   . Diabetes Father   . Hyperlipidemia Father   . Hypertension Father   . Parkinson's disease Father   . Alcohol  abuse Paternal Uncle   . Heart attack Paternal Grandfather     Social History:  Social History   Socioeconomic History  . Marital status: Married    Spouse name: Not on file  . Number of children: Not on file  . Years of education: Not on file  . Highest education level: Not on file  Occupational History  . Occupation: Merchandiser, retail  Tobacco Use  . Smoking status: Current Some Day Smoker    Packs/day: 0.30    Types: Cigarettes  . Smokeless tobacco: Never Used  Substance and Sexual Activity  . Alcohol use: No    Alcohol/week: 2.0 standard drinks    Types: 2 Glasses of wine per week    Comment: Every couple of weeks  . Drug use: No  . Sexual activity: Yes    Partners: Male  Other Topics Concern  . Not on file  Social History Narrative  . Not on file   Social Determinants of Health   Financial Resource Strain:   . Difficulty of Paying Living Expenses:   Food Insecurity:   . Worried About Programme researcher, broadcasting/film/video in the Last Year:   . Barista in the Last Year:   Transportation Needs:   . Freight forwarder (Medical):   Marland Kitchen Lack of Transportation (Non-Medical):   Physical Activity:   . Days of Exercise per Week:   . Minutes of Exercise per Session:   Stress:   . Feeling of  Stress :   Social Connections:   . Frequency of Communication with Friends and Family:   . Frequency of Social Gatherings with Friends and Family:   . Attends Religious Services:   . Active Member of Clubs or Organizations:   . Attends Archivist Meetings:   Marland Kitchen Marital Status:     Allergies:  Allergies  Allergen Reactions  . Codeine Nausea Only  . Nifedipine     fatigue  . Penicillins   . Sulfamethoxazole Nausea And Vomiting    Metabolic Disorder Labs: Lab Results  Component Value Date   HGBA1C 5.7 (H) 07/14/2015   MPG 117 07/14/2015   MPG 126 (H) 07/25/2014   No results found for: PROLACTIN Lab Results  Component Value Date   CHOL 193 06/25/2013   TRIG 122  06/25/2013   HDL 48 06/25/2013   CHOLHDL 4.0 06/25/2013   VLDL 24 06/25/2013   LDLCALC 121 (H) 06/25/2013     Current Medications: Current Outpatient Medications  Medication Sig Dispense Refill  . ALPRAZolam (XANAX) 0.5 MG tablet Take 1 tablet (0.5 mg total) by mouth 2 (two) times daily as needed for anxiety. 60 tablet 0  . amphetamine-dextroamphetamine (ADDERALL) 10 MG tablet Take 1 tablet (10 mg total) by mouth daily. 30 tablet 0  . azithromycin (ZITHROMAX Z-PAK) 250 MG tablet Take 2 tablets (500 mg) on  Day 1,  followed by 1 tablet (250 mg) once daily on Days 2 through 5. (Patient not taking: Reported on 08/18/2017) 6 tablet 0  . citalopram (CELEXA) 10 MG tablet TAKE 1 AND 1/2 TABLETS DAILY BY MOUTH 45 tablet 3  . fluticasone (FLONASE) 50 MCG/ACT nasal spray One spray in each nostril twice a day, use left hand for right nostril, and right hand for left nostril. 48 g 3  . Multiple Vitamin (MULTIVITAMIN) tablet Take 1 tablet by mouth daily.    . predniSONE (DELTASONE) 50 MG tablet One tab PO daily for 5 days. (Patient not taking: Reported on 08/18/2017) 5 tablet 0   No current facility-administered medications for this visit.      Psychiatric Specialty Exam: Review of Systems  Cardiovascular: Negative for palpitations.  Psychiatric/Behavioral: Negative for suicidal ideas.    currently breastfeeding.There is no height or weight on file to calculate BMI.  General Appearance:   Eye Contact:    Speech:  Normal Rate  Volume:  Decreased  Mood: fair  Affect:  constricted  Thought Process:  Goal Directed  Orientation:  Full (Time, Place, and Person)  Thought Content: clear  Suicidal Thoughts:  No  Homicidal Thoughts:  No  Memory:  Immediate;   Fair Recent;   Good  Judgement:  Fair  Insight:  Fair  Psychomotor Activity:  Normal  Concentration:  Concentration: Fair and Attention Span: Fair  Recall:  [employer] of Knowledge: Fair  Language: Fair  Akathisia:  Negative  Handed:   Right  AIMS (if indicated):    Assets:  Husband support  ADL's:  Intact  Cognition: WNL  Sleep:  Fair to variable     Treatment Plan Summary:Medication management and Plan as follows   Grief: better. Continue lexapro 1. ADHD: gets distracted without meds, continue 10mg  or half a day 2. PTSD:baseline,not worse. Continue celexa 3. Depression: remains stable. Continue celexa Discussed compliance I discussed the assessment and treatment plan with the patient. The patient was provided an opportunity to ask questions and all were answered. The patient agreed with the plan and demonstrated an understanding  of the instructions.   The patient was advised to call back or seek an in-person evaluation if the symptoms worsen or if the condition fails to improve as anticipated. Non face to face time spent: Renewed refills. Questions addressed fu 3-4 m Thresa Ross, MD 08/30/2019, 1:26 PM

## 2019-09-28 ENCOUNTER — Telehealth (HOSPITAL_COMMUNITY): Payer: Self-pay

## 2019-09-28 MED ORDER — AMPHETAMINE-DEXTROAMPHETAMINE 10 MG PO TABS: 10.0000 mg | ORAL_TABLET | Freq: Every day | ORAL | 0 refills | Status: DC

## 2019-09-28 NOTE — Telephone Encounter (Signed)
sent 

## 2019-09-28 NOTE — Telephone Encounter (Signed)
Patient needs Adderall sent to [facility] in [place] 

## 2019-10-25 ENCOUNTER — Telehealth (HOSPITAL_COMMUNITY): Payer: Self-pay

## 2019-10-25 MED ORDER — AMPHETAMINE-DEXTROAMPHETAMINE 10 MG PO TABS: 10.0000 mg | ORAL_TABLET | Freq: Every day | ORAL | 0 refills | Status: DC

## 2019-10-25 NOTE — Telephone Encounter (Signed)
Patient needs a refill on Adderall sent to [facility] in [place]

## 2019-10-25 NOTE — Telephone Encounter (Signed)
sent 

## 2019-11-29 ENCOUNTER — Telehealth (HOSPITAL_COMMUNITY): Payer: Self-pay | Admitting: Psychiatry

## 2019-11-29 MED ORDER — AMPHETAMINE-DEXTROAMPHETAMINE 10 MG PO TABS: 10.0000 mg | ORAL_TABLET | Freq: Every day | ORAL | 0 refills | Status: DC

## 2019-11-29 NOTE — Telephone Encounter (Signed)
sent 

## 2019-11-29 NOTE — Telephone Encounter (Signed)
Pt needs refill on adderall [facility] [place]

## 2019-12-24 ENCOUNTER — Other Ambulatory Visit: Payer: Self-pay | Admitting: Physician Assistant

## 2019-12-24 DIAGNOSIS — Z1231 Encounter for screening mammogram for malignant neoplasm of breast: Secondary | ICD-10-CM

## 2019-12-27 ENCOUNTER — Ambulatory Visit
Admission: RE | Admit: 2019-12-27 | Discharge: 2019-12-27 | Disposition: A | Discharge status: Home / Self Care | Payer: Federal, State, Local not specified - PPO | Source: Ambulatory Visit | Attending: Physician Assistant | Admitting: Physician Assistant

## 2019-12-27 ENCOUNTER — Other Ambulatory Visit: Payer: Self-pay

## 2019-12-27 DIAGNOSIS — Z1231 Encounter for screening mammogram for malignant neoplasm of breast: Secondary | ICD-10-CM

## 2019-12-28 NOTE — Progress Notes (Signed)
Normal mammogram. Follow up in 1 year.

## 2019-12-29 ENCOUNTER — Telehealth (HOSPITAL_COMMUNITY): Payer: Self-pay

## 2019-12-29 MED ORDER — AMPHETAMINE-DEXTROAMPHETAMINE 10 MG PO TABS: 10.0000 mg | ORAL_TABLET | Freq: Every day | ORAL | 0 refills | Status: DC

## 2019-12-29 NOTE — Telephone Encounter (Signed)
Patient needs a refill on Adderall sent to [facility] in [place]

## 2019-12-29 NOTE — Telephone Encounter (Signed)
Sent. Please ask her to keep one pharmacy, will send next refill to same pharmacy only

## 2020-01-26 ENCOUNTER — Telehealth (HOSPITAL_COMMUNITY): Payer: Self-pay

## 2020-01-26 MED ORDER — AMPHETAMINE-DEXTROAMPHETAMINE 10 MG PO TABS: 10.0000 mg | ORAL_TABLET | Freq: Every day | ORAL | 0 refills | Status: DC

## 2020-01-26 NOTE — Telephone Encounter (Signed)
sent 

## 2020-01-26 NOTE — Telephone Encounter (Signed)
Patient needs a refill on Adderall sent to [facility] in [place]

## 2020-02-16 ENCOUNTER — Telehealth: Payer: Self-pay | Admitting: Physician Assistant

## 2020-02-16 NOTE — Telephone Encounter (Signed)
Patient wanted to know if she can re-establish care with you. IT has been over 3 years. Wanted a whole CBC panel ordered and wanted to do a virtual. Please advise.

## 2020-02-17 ENCOUNTER — Telehealth (INDEPENDENT_AMBULATORY_CARE_PROVIDER_SITE_OTHER): Payer: Federal, State, Local not specified - PPO | Admitting: Psychiatry

## 2020-02-17 ENCOUNTER — Encounter (HOSPITAL_COMMUNITY): Payer: Self-pay | Admitting: Psychiatry

## 2020-02-17 DIAGNOSIS — F900 Attention-deficit hyperactivity disorder, predominantly inattentive type: Secondary | ICD-10-CM

## 2020-02-17 DIAGNOSIS — F4310 Post-traumatic stress disorder, unspecified: Secondary | ICD-10-CM | POA: Diagnosis not present

## 2020-02-17 DIAGNOSIS — F331 Major depressive disorder, recurrent, moderate: Secondary | ICD-10-CM

## 2020-02-17 MED ORDER — ALPRAZOLAM 0.5 MG PO TABS: 0.5000 mg | ORAL_TABLET | Freq: Two times a day (BID) | ORAL | 0 refills | Status: DC | PRN

## 2020-02-17 MED ORDER — CITALOPRAM HYDROBROMIDE 10 MG PO TABS: ORAL_TABLET | ORAL | 3 refills | Status: DC

## 2020-02-17 NOTE — Telephone Encounter (Signed)
Please schedule

## 2020-02-17 NOTE — Progress Notes (Signed)
BH MD/PA/NP OP Progress Note  02/17/2020 11:49 AM Morgan Miller  MRN:  [id number]  Chief Complaint:  depression follow up     I connected with Morgan Miller on 02/17/20 at 11:30 AM EST by telephone and verified that I am speaking with the correct person using two identifiers.   I discussed the limitations, risks, security and privacy concerns of performing an evaluation and management service by telephone and the availability of in person appointments. I also discussed with the patient that there may be a patient responsible charge related to this service. The patient expressed understanding and agreed to proceed. Patient location home Provider location home office   HPI: History of Present Illness:  Patient is a 39 years old [place] American descent married female referred initially  by primary care physician for anxiety depression and possible PTSD. Also been diagnosed with ADHD.  Has been doing fair, resigned from [place] for now to focus on taking care of her 2 sons and schooling Husband is working Still on Freight forwarder case still pending Gets distracted without adderall  Aggravating factors; past job concern Modifying factor : husband No psychosis Duration: since around 2015    Visit Diagnosis:    ICD-10-CM   1. Moderate episode of recurrent major depressive disorder (HCC)  F33.1   2. PTSD (post-traumatic stress disorder)  F43.10   3. Attention deficit hyperactivity disorder (ADHD), predominantly inattentive type  F90.0     Past Psychiatric History: depression  Past Medical History:  Past Medical History:  Diagnosis Date  . Complication of anesthesia    epidural caused syncope  . Hypertension   . Preeclampsia in postpartum period     Past Surgical History:  Procedure Laterality Date  . CESAREAN SECTION       Family History:  Family History  Problem Relation Age of Onset  . Hyperlipidemia Mother   . Depression Mother   . Anxiety  disorder Mother   . Diabetes Father   . Hyperlipidemia Father   . Hypertension Father   . Parkinson's disease Father   . Alcohol abuse Paternal Uncle   . Heart attack Paternal Grandfather     Social History:  Social History   Socioeconomic History  . Marital status: Married    Spouse name: Not on file  . Number of children: Not on file  . Years of education: Not on file  . Highest education level: Not on file  Occupational History  . Occupation: Merchandiser, retail  Tobacco Use  . Smoking status: Current Some Day Smoker    Packs/day: 0.30    Types: Cigarettes  . Smokeless tobacco: Never Used  Vaping Use  . Vaping Use: Never used  Substance and Sexual Activity  . Alcohol use: No    Alcohol/week: 2.0 standard drinks    Types: 2 Glasses of wine per week    Comment: Every couple of weeks  . Drug use: No  . Sexual activity: Yes    Partners: Male  Other Topics Concern  . Not on file  Social History Narrative  . Not on file   Social Determinants of Health   Financial Resource Strain:   . Difficulty of Paying Living Expenses: Not on file  Food Insecurity:   . Worried About Programme researcher, broadcasting/film/video in the Last Year: Not on file  . Ran Out of Food in the Last Year: Not on file  Transportation Needs:   . Lack of Transportation (Medical): Not on  file  . Lack of Transportation (Non-Medical): Not on file  Physical Activity:   . Days of Exercise per Week: Not on file  . Minutes of Exercise per Session: Not on file  Stress:   . Feeling of Stress : Not on file  Social Connections:   . Frequency of Communication with Friends and Family: Not on file  . Frequency of Social Gatherings with Friends and Family: Not on file  . Attends Religious Services: Not on file  . Active Member of Clubs or Organizations: Not on file  . Attends Banker Meetings: Not on file  . Marital Status: Not on file    Allergies:  Allergies  Allergen Reactions  . Codeine Nausea Only  . Nifedipine      fatigue  . Penicillins   . Sulfamethoxazole Nausea And Vomiting    Metabolic Disorder Labs: Lab Results  Component Value Date   HGBA1C 5.7 (H) 07/14/2015   MPG 117 07/14/2015   MPG 126 (H) 07/25/2014   No results found for: PROLACTIN Lab Results  Component Value Date   CHOL 193 06/25/2013   TRIG 122 06/25/2013   HDL 48 06/25/2013   CHOLHDL 4.0 06/25/2013   VLDL 24 06/25/2013   LDLCALC 121 (H) 06/25/2013     Current Medications: Current Outpatient Medications  Medication Sig Dispense Refill  . ALPRAZolam (XANAX) 0.5 MG tablet Take 1 tablet (0.5 mg total) by mouth 2 (two) times daily as needed for anxiety. 60 tablet 0  . amphetamine-dextroamphetamine (ADDERALL) 10 MG tablet Take 1 tablet (10 mg total) by mouth daily. 30 tablet 0  . azithromycin (ZITHROMAX Z-PAK) 250 MG tablet Take 2 tablets (500 mg) on  Day 1,  followed by 1 tablet (250 mg) once daily on Days 2 through 5. (Patient not taking: Reported on 08/18/2017) 6 tablet 0  . citalopram (CELEXA) 10 MG tablet TAKE 1 AND 1/2 TABLETS DAILY BY MOUTH 45 tablet 3  . fluticasone (FLONASE) 50 MCG/ACT nasal spray One spray in each nostril twice a day, use left hand for right nostril, and right hand for left nostril. 48 g 3  . Multiple Vitamin (MULTIVITAMIN) tablet Take 1 tablet by mouth daily.    . predniSONE (DELTASONE) 50 MG tablet One tab PO daily for 5 days. (Patient not taking: Reported on 08/18/2017) 5 tablet 0   No current facility-administered medications for this visit.      Psychiatric Specialty Exam: Review of Systems  Cardiovascular: Negative for palpitations.  Psychiatric/Behavioral: Negative for suicidal ideas.    currently breastfeeding.There is no height or weight on file to calculate BMI.  General Appearance:   Eye Contact:    Speech:  Normal Rate  Volume:  Decreased  Mood: fair  Affect:  constricted  Thought Process:  Goal Directed  Orientation:  Full (Time, Place, and Person)  Thought Content: clear   Suicidal Thoughts:  No  Homicidal Thoughts:  No  Memory:  Immediate;   Fair Recent;   Good  Judgement:  Fair  Insight:  Fair  Psychomotor Activity:  Normal  Concentration:  Concentration: Fair and Attention Span: Fair  Recall:  [employer] of Knowledge: Fair  Language: Fair  Akathisia:  Negative  Handed:  Right  AIMS (if indicated):    Assets:  Husband support  ADL's:  Intact  Cognition: WNL  Sleep:  Fair to variable     Treatment Plan Summary:Medication management and Plan as follows   Grief: better. Continue lexapro 1. ADHD: gets  distracted without but I cautioned not to take it regularly since not working 2. PTSD:baseline, Continue celexa Discussed to avoid ativan bid and skip doses since not working and staying at home.  3. Depression: remains stable. Continue celexa Discussed compliance I discussed the assessment and treatment plan with the patient. The patient was provided an opportunity to ask questions and all were answered. The patient agreed with the plan and demonstrated an understanding of the instructions.   The patient was advised to call back or seek an in-person evaluation if the symptoms worsen or if the condition fails to improve as anticipated. Non face to face time spent: Renewed refills. Questions addressed fu 3-4 m Thresa Ross, MD 02/17/2020, 11:49 AM

## 2020-02-17 NOTE — Telephone Encounter (Signed)
Ok to re-establish 

## 2020-02-17 NOTE — Telephone Encounter (Signed)
Please advise 

## 2020-02-18 NOTE — Telephone Encounter (Signed)
LVM to call us back and schedule appt  °

## 2020-02-21 ENCOUNTER — Ambulatory Visit: Payer: Federal, State, Local not specified - PPO | Admitting: Physician Assistant

## 2020-02-23 ENCOUNTER — Other Ambulatory Visit (HOSPITAL_COMMUNITY): Payer: Self-pay

## 2020-02-23 MED ORDER — CITALOPRAM HYDROBROMIDE 10 MG PO TABS: ORAL_TABLET | ORAL | 0 refills | Status: DC

## 2020-02-28 ENCOUNTER — Telehealth (HOSPITAL_COMMUNITY): Payer: Federal, State, Local not specified - PPO | Admitting: Psychiatry

## 2020-02-28 ENCOUNTER — Telehealth (HOSPITAL_COMMUNITY): Payer: Self-pay

## 2020-02-28 MED ORDER — AMPHETAMINE-DEXTROAMPHETAMINE 10 MG PO TABS: 10.0000 mg | ORAL_TABLET | Freq: Every day | ORAL | 0 refills | Status: DC

## 2020-02-28 NOTE — Telephone Encounter (Signed)
Patient needs a refill on Adderall sent to [facility] in [place]

## 2020-02-28 NOTE — Telephone Encounter (Signed)
sent 

## 2020-03-17 ENCOUNTER — Telehealth (HOSPITAL_COMMUNITY): Payer: Self-pay | Admitting: Psychiatry

## 2020-03-17 MED ORDER — ALPRAZOLAM 0.5 MG PO TABS: 0.5000 mg | ORAL_TABLET | Freq: Two times a day (BID) | ORAL | 0 refills | Status: DC | PRN

## 2020-03-17 NOTE — Telephone Encounter (Signed)
sent 

## 2020-03-17 NOTE — Telephone Encounter (Signed)
Per pt- needs refill on xanax [facility] [place]

## 2020-03-27 ENCOUNTER — Telehealth (HOSPITAL_COMMUNITY): Payer: Self-pay | Admitting: Psychiatry

## 2020-03-27 MED ORDER — AMPHETAMINE-DEXTROAMPHETAMINE 10 MG PO TABS: 10.0000 mg | ORAL_TABLET | Freq: Every day | ORAL | 0 refills | Status: DC

## 2020-03-27 NOTE — Telephone Encounter (Signed)
sent 

## 2020-03-27 NOTE — Telephone Encounter (Signed)
Pt calling- needs refill on adderall [facility] [place]

## 2020-04-26 ENCOUNTER — Telehealth (HOSPITAL_COMMUNITY): Payer: Self-pay

## 2020-04-26 MED ORDER — ALPRAZOLAM 0.5 MG PO TABS: 0.5000 mg | ORAL_TABLET | Freq: Two times a day (BID) | ORAL | 0 refills | Status: DC | PRN

## 2020-04-26 MED ORDER — AMPHETAMINE-DEXTROAMPHETAMINE 10 MG PO TABS: 10.0000 mg | ORAL_TABLET | Freq: Every day | ORAL | 0 refills | Status: DC

## 2020-04-26 NOTE — Telephone Encounter (Signed)
Patient needs a refill on Xanax and Adderall sent to [facility] in [place]

## 2020-04-26 NOTE — Telephone Encounter (Signed)
sent 

## 2020-05-29 ENCOUNTER — Telehealth (HOSPITAL_COMMUNITY): Payer: Self-pay

## 2020-05-29 MED ORDER — ALPRAZOLAM 0.5 MG PO TABS: 0.5000 mg | ORAL_TABLET | Freq: Two times a day (BID) | ORAL | 0 refills | Status: DC | PRN

## 2020-05-29 MED ORDER — AMPHETAMINE-DEXTROAMPHETAMINE 10 MG PO TABS: 10.0000 mg | ORAL_TABLET | Freq: Every day | ORAL | 0 refills | Status: DC

## 2020-05-29 NOTE — Telephone Encounter (Signed)
sent 

## 2020-05-29 NOTE — Telephone Encounter (Signed)
Patient needs refills on both Xanax and Adderall sent to [facility] in [place]

## 2020-06-26 ENCOUNTER — Other Ambulatory Visit (HOSPITAL_COMMUNITY): Payer: Self-pay | Admitting: Psychiatry

## 2020-06-26 ENCOUNTER — Telehealth (HOSPITAL_COMMUNITY): Payer: Self-pay | Admitting: Psychiatry

## 2020-06-26 MED ORDER — AMPHETAMINE-DEXTROAMPHETAMINE 10 MG PO TABS: 10.0000 mg | ORAL_TABLET | Freq: Every day | ORAL | 0 refills | Status: DC

## 2020-06-26 MED ORDER — ALPRAZOLAM 0.5 MG PO TABS: 0.5000 mg | ORAL_TABLET | Freq: Two times a day (BID) | ORAL | 0 refills | Status: DC | PRN

## 2020-06-26 NOTE — Telephone Encounter (Signed)
Patient needs refills on both Xanax and Adderall sent to [facility] in [place]

## 2020-06-26 NOTE — Telephone Encounter (Signed)
sent 

## 2020-07-14 ENCOUNTER — Encounter (HOSPITAL_COMMUNITY): Payer: Self-pay | Admitting: Psychiatry

## 2020-07-14 ENCOUNTER — Telehealth (INDEPENDENT_AMBULATORY_CARE_PROVIDER_SITE_OTHER): Payer: Federal, State, Local not specified - PPO | Admitting: Psychiatry

## 2020-07-14 DIAGNOSIS — F4310 Post-traumatic stress disorder, unspecified: Secondary | ICD-10-CM

## 2020-07-14 DIAGNOSIS — F331 Major depressive disorder, recurrent, moderate: Secondary | ICD-10-CM

## 2020-07-14 DIAGNOSIS — F900 Attention-deficit hyperactivity disorder, predominantly inattentive type: Secondary | ICD-10-CM | POA: Diagnosis not present

## 2020-07-14 MED ORDER — CITALOPRAM HYDROBROMIDE 10 MG PO TABS: ORAL_TABLET | ORAL | 0 refills | Status: DC

## 2020-07-14 NOTE — Progress Notes (Signed)
BH MD/PA/NP OP Progress Note  07/14/2020 10:39 AM Morgan Miller  MRN:  [id number] Virtual Visit via Video Note  I connected with Morgan Miller on 07/14/20 at 10:30 AM EDT by a video enabled telemedicine application and verified that I am speaking with the correct person using two identifiers.  Location: Patient: home Provider: home office   I discussed the limitations of evaluation and management by telemedicine and the availability of in person appointments. The patient expressed understanding and agreed to proceed.     I discussed the assessment and treatment plan with the patient. The patient was provided an opportunity to ask questions and all were answered. The patient agreed with the plan and demonstrated an understanding of the instructions.   The patient was advised to call back or seek an in-person evaluation if the symptoms worsen or if the condition fails to improve as anticipated.  I provided 12 minutes of non-face-to-face time during this encounter including chart review and documentation    Chief Complaint:  depression follow up      HPI: History of Present Illness:  Patient is a 40 years old [place] descent married female referred initially  by primary care physician for anxiety depression and possible PTSD. Also been diagnosed with ADHD.  Doing fair or baseline, kids are at home, she plans to find another job work from home Relationship is fine     Legal case still pending Gets distracted without adderall  Aggravating factors; past job concern Modifying factor : husband No psychosis Duration: since around 2015    Visit Diagnosis:    ICD-10-CM   1. Moderate episode of recurrent major depressive disorder (HCC)  F33.1   2. PTSD (post-traumatic stress disorder)  F43.10   3. Attention deficit hyperactivity disorder (ADHD), predominantly inattentive type  F90.0     Past Psychiatric History: depression  Past Medical History:   Past Medical History:  Diagnosis Date  . Complication of anesthesia    epidural caused syncope  . Hypertension   . Preeclampsia in postpartum period     Past Surgical History:  Procedure Laterality Date  . CESAREAN SECTION       Family History:  Family History  Problem Relation Age of Onset  . Hyperlipidemia Mother   . Depression Mother   . Anxiety disorder Mother   . Diabetes Father   . Hyperlipidemia Father   . Hypertension Father   . Parkinson's disease Father   . Alcohol abuse Paternal Uncle   . Heart attack Paternal Grandfather     Social History:  Social History   Socioeconomic History  . Marital status: Married    Spouse name: Not on file  . Number of children: Not on file  . Years of education: Not on file  . Highest education level: Not on file  Occupational History  . Occupation: Merchandiser, retail  Tobacco Use  . Smoking status: Current Some Day Smoker    Packs/day: 0.30    Types: Cigarettes  . Smokeless tobacco: Never Used  Vaping Use  . Vaping Use: Never used  Substance and Sexual Activity  . Alcohol use: No    Alcohol/week: 2.0 standard drinks    Types: 2 Glasses of wine per week    Comment: Every couple of weeks  . Drug use: No  . Sexual activity: Yes    Partners: Male  Other Topics Concern  . Not on file  Social History Narrative  . Not on file   Social Determinants  of Health   Financial Resource Strain: Not on file  Food Insecurity: Not on file  Transportation Needs: Not on file  Physical Activity: Not on file  Stress: Not on file  Social Connections: Not on file    Allergies:  Allergies  Allergen Reactions  . Codeine Nausea Only  . Nifedipine     fatigue  . Penicillins   . Sulfamethoxazole Nausea And Vomiting    Metabolic Disorder Labs: Lab Results  Component Value Date   HGBA1C 5.7 (H) 07/14/2015   MPG 117 07/14/2015   MPG 126 (H) 07/25/2014   No results found for: PROLACTIN Lab Results  Component Value Date   CHOL  193 06/25/2013   TRIG 122 06/25/2013   HDL 48 06/25/2013   CHOLHDL 4.0 06/25/2013   VLDL 24 06/25/2013   LDLCALC 121 (H) 06/25/2013     Current Medications: Current Outpatient Medications  Medication Sig Dispense Refill  . ALPRAZolam (XANAX) 0.5 MG tablet Take 1 tablet (0.5 mg total) by mouth 2 (two) times daily as needed for anxiety. 60 tablet 0  . amphetamine-dextroamphetamine (ADDERALL) 10 MG tablet Take 1 tablet (10 mg total) by mouth daily. 30 tablet 0  . azithromycin (ZITHROMAX Z-PAK) 250 MG tablet Take 2 tablets (500 mg) on  Day 1,  followed by 1 tablet (250 mg) once daily on Days 2 through 5. (Patient not taking: Reported on 08/18/2017) 6 tablet 0  . citalopram (CELEXA) 10 MG tablet TAKE 1 AND 1/2 TABLETS DAILY BY MOUTH 135 tablet 0  . fluticasone (FLONASE) 50 MCG/ACT nasal spray One spray in each nostril twice a day, use left hand for right nostril, and right hand for left nostril. 48 g 3  . Multiple Vitamin (MULTIVITAMIN) tablet Take 1 tablet by mouth daily.    . predniSONE (DELTASONE) 50 MG tablet One tab PO daily for 5 days. (Patient not taking: Reported on 08/18/2017) 5 tablet 0   No current facility-administered medications for this visit.      Psychiatric Specialty Exam: Review of Systems  Cardiovascular: Negative for chest pain.  Psychiatric/Behavioral: Negative for substance abuse and suicidal ideas.    currently breastfeeding.There is no height or weight on file to calculate BMI.  General Appearance:   Eye Contact:    Speech:  Normal Rate  Volume:  Decreased  Mood:  fair  Affect:  constricted  Thought Process:  Goal Directed  Orientation:  Full (Time, Place, and Person)  Thought Content: clear  Suicidal Thoughts:  No  Homicidal Thoughts:  No  Memory:  Immediate;   Fair Recent;   Good  Judgement:  Fair  Insight:  Fair  Psychomotor Activity:  Normal  Concentration:  Concentration: Fair and Attention Span: Fair  Recall:  [employer] of Knowledge: Fair   Language: Fair  Akathisia:  Negative  Handed:  Right  AIMS (if indicated):    Assets:  Husband support  ADL's:  Intact  Cognition: WNL  Sleep:  Fair to variable     Treatment Plan Summary:Medication management and Plan as follows   Grief: better. Continue lexapro 1. ADHD: fair with adderall, continue 2. PTSD:baseline, Continue celexa, avoid xanax on a regular basis 3. Depression: remains stable. Continue celexa Discussed compliance  Renewed refills. Questions addressed fu 3-4 m Thresa Ross, MD 07/14/2020, 10:39 AM

## 2020-07-24 ENCOUNTER — Telehealth (HOSPITAL_COMMUNITY): Payer: Self-pay

## 2020-07-24 MED ORDER — ALPRAZOLAM 0.5 MG PO TABS: 0.5000 mg | ORAL_TABLET | Freq: Two times a day (BID) | ORAL | 0 refills | Status: DC | PRN

## 2020-07-24 MED ORDER — AMPHETAMINE-DEXTROAMPHETAMINE 10 MG PO TABS: 10.0000 mg | ORAL_TABLET | Freq: Every day | ORAL | 0 refills | Status: DC

## 2020-07-24 NOTE — Telephone Encounter (Signed)
sent 

## 2020-07-24 NOTE — Telephone Encounter (Signed)
Patient needs a refill on both Xanax and Adderall sent to [facility] in [place]

## 2020-08-23 ENCOUNTER — Telehealth (HOSPITAL_COMMUNITY): Payer: Self-pay | Admitting: Psychiatry

## 2020-08-23 MED ORDER — ALPRAZOLAM 0.5 MG PO TABS: 0.5000 mg | ORAL_TABLET | Freq: Two times a day (BID) | ORAL | 0 refills | Status: DC | PRN

## 2020-08-23 MED ORDER — AMPHETAMINE-DEXTROAMPHETAMINE 10 MG PO TABS: 10.0000 mg | ORAL_TABLET | Freq: Every day | ORAL | 0 refills | Status: DC

## 2020-08-23 NOTE — Telephone Encounter (Signed)
Per pt Needs refill on adderall and xanax [facility] in [place]

## 2020-08-23 NOTE — Telephone Encounter (Signed)
sent 

## 2020-09-26 ENCOUNTER — Telehealth (HOSPITAL_COMMUNITY): Payer: Self-pay

## 2020-09-26 MED ORDER — ALPRAZOLAM 0.5 MG PO TABS: 0.5000 mg | ORAL_TABLET | Freq: Two times a day (BID) | ORAL | 0 refills | Status: DC | PRN

## 2020-09-26 MED ORDER — AMPHETAMINE-DEXTROAMPHETAMINE 10 MG PO TABS: 10.0000 mg | ORAL_TABLET | Freq: Every day | ORAL | 0 refills | Status: DC

## 2020-09-26 NOTE — Telephone Encounter (Signed)
Patient needs a refill on both Adderall and Xanax sent to  [facility] in [place]

## 2020-11-13 ENCOUNTER — Encounter (HOSPITAL_COMMUNITY): Payer: Self-pay | Admitting: Psychiatry

## 2020-11-13 ENCOUNTER — Telehealth (INDEPENDENT_AMBULATORY_CARE_PROVIDER_SITE_OTHER): Payer: Self-pay | Admitting: Psychiatry

## 2020-11-13 DIAGNOSIS — F410 Panic disorder [episodic paroxysmal anxiety] without agoraphobia: Secondary | ICD-10-CM

## 2020-11-13 DIAGNOSIS — F900 Attention-deficit hyperactivity disorder, predominantly inattentive type: Secondary | ICD-10-CM

## 2020-11-13 DIAGNOSIS — F331 Major depressive disorder, recurrent, moderate: Secondary | ICD-10-CM

## 2020-11-13 MED ORDER — ALPRAZOLAM 0.5 MG PO TABS: 0.5000 mg | ORAL_TABLET | Freq: Two times a day (BID) | ORAL | 0 refills | Status: DC | PRN

## 2020-11-13 MED ORDER — AMPHETAMINE-DEXTROAMPHETAMINE 10 MG PO TABS: 10.0000 mg | ORAL_TABLET | Freq: Every day | ORAL | 0 refills | Status: DC

## 2020-11-13 NOTE — Progress Notes (Signed)
BH MD/PA/NP OP Progress Note  11/13/2020 1:37 PM Morgan Miller  MRN:  [id number] Virtual Visit via Telephone Note  I connected with [name] on 11/13/20 at  1:30 PM EDT by telephone and verified that I am speaking with the correct person using two identifiers.  Location: Patient: home Provider: home office   I discussed the limitations, risks, security and privacy concerns of performing an evaluation and management service by telephone and the availability of in person appointments. I also discussed with the patient that there may be a patient responsible charge related to this service. The patient expressed understanding and agreed to proceed.       I discussed the assessment and treatment plan with the patient. The patient was provided an opportunity to ask questions and all were answered. The patient agreed with the plan and demonstrated an understanding of the instructions.   The patient was advised to call back or seek an in-person evaluation if the symptoms worsen or if the condition fails to improve as anticipated.   Chief Complaint:  depression follow up   HPI: History of Present Illness:  Patient is a 40 years old [place] descent married female referred initially  by primary care physician for anxiety depression and possible PTSD. Also been diagnosed with ADHD.  Doing fair, youngest is now 4years Attention and anxiety manageable Have not started work yet  Legal case still pending Gets distracted without adderall  Aggravating factors; past job concern Modifying factor : husband No psychosis Duration: since around 2015    Visit Diagnosis:    ICD-10-CM   1. Moderate episode of recurrent major depressive disorder (HCC)  F33.1     2. Attention deficit hyperactivity disorder (ADHD), predominantly inattentive type  F90.0     3. Panic disorder  F41.0       Past Psychiatric History: depression  Past Medical History:  Past Medical  History:  Diagnosis Date   Complication of anesthesia    epidural caused syncope   Hypertension    Preeclampsia in postpartum period     Past Surgical History:  Procedure Laterality Date   CESAREAN SECTION       Family History:  Family History  Problem Relation Age of Onset   Hyperlipidemia Mother    Depression Mother    Anxiety disorder Mother    Diabetes Father    Hyperlipidemia Father    Hypertension Father    Parkinson's disease Father    Alcohol abuse Paternal Uncle    Heart attack Paternal Grandfather     Social History:  Social History   Socioeconomic History   Marital status: Married    Spouse name: Not on file   Number of children: Not on file   Years of education: Not on file   Highest education level: Not on file  Occupational History   Occupation: Merchandiser, retail  Tobacco Use   Smoking status: Some Days    Packs/day: 0.30    Types: Cigarettes   Smokeless tobacco: Never  Vaping Use   Vaping Use: Never used  Substance and Sexual Activity   Alcohol use: No    Alcohol/week: 2.0 standard drinks    Types: 2 Glasses of wine per week    Comment: Every couple of weeks   Drug use: No   Sexual activity: Yes    Partners: Male  Other Topics Concern   Not on file  Social History Narrative   Not on file   Social Determinants of Health  Financial Resource Strain: Not on file  Food Insecurity: Not on file  Transportation Needs: Not on file  Physical Activity: Not on file  Stress: Not on file  Social Connections: Not on file    Allergies:  Allergies  Allergen Reactions   Codeine Nausea Only   Nifedipine     fatigue   Penicillins    Sulfamethoxazole Nausea And Vomiting    Metabolic Disorder Labs: Lab Results  Component Value Date   HGBA1C 5.7 (H) 07/14/2015   MPG 117 07/14/2015   MPG 126 (H) 07/25/2014   No results found for: PROLACTIN Lab Results  Component Value Date   CHOL 193 06/25/2013   TRIG 122 06/25/2013   HDL 48 06/25/2013    CHOLHDL 4.0 06/25/2013   VLDL 24 06/25/2013   LDLCALC 121 (H) 06/25/2013     Current Medications: Current Outpatient Medications  Medication Sig Dispense Refill   ALPRAZolam (XANAX) 0.5 MG tablet Take 1 tablet (0.5 mg total) by mouth 2 (two) times daily as needed for anxiety. 60 tablet 0   amphetamine-dextroamphetamine (ADDERALL) 10 MG tablet Take 1 tablet (10 mg total) by mouth daily. 30 tablet 0   azithromycin (ZITHROMAX Z-PAK) 250 MG tablet Take 2 tablets (500 mg) on  Day 1,  followed by 1 tablet (250 mg) once daily on Days 2 through 5. (Patient not taking: Reported on 08/18/2017) 6 tablet 0   citalopram (CELEXA) 10 MG tablet TAKE 1 AND 1/2 TABLETS DAILY BY MOUTH 135 tablet 0   fluticasone (FLONASE) 50 MCG/ACT nasal spray One spray in each nostril twice a day, use left hand for right nostril, and right hand for left nostril. 48 g 3   Multiple Vitamin (MULTIVITAMIN) tablet Take 1 tablet by mouth daily.     predniSONE (DELTASONE) 50 MG tablet One tab PO daily for 5 days. (Patient not taking: Reported on 08/18/2017) 5 tablet 0   No current facility-administered medications for this visit.      Psychiatric Specialty Exam: Review of Systems  Cardiovascular:  Negative for chest pain.  Psychiatric/Behavioral:  Negative for substance abuse and suicidal ideas.    currently breastfeeding.There is no height or weight on file to calculate BMI.  General Appearance:   Eye Contact:    Speech:  Normal Rate  Volume:  Decreased  Mood:  fair  Affect:  constricted  Thought Process:  Goal Directed  Orientation:  Full (Time, Place, and Person)  Thought Content: clear  Suicidal Thoughts:  No  Homicidal Thoughts:  No  Memory:  Immediate;   Fair Recent;   Good  Judgement:  Fair  Insight:  Fair  Psychomotor Activity:  Normal  Concentration:  Concentration: Fair and Attention Span: Fair  Recall:  [employer] of Knowledge: Fair  Language: Fair  Akathisia:  Negative  Handed:  Right  AIMS (if  indicated):    Assets:  Husband support  ADL's:  Intact  Cognition: WNL  Sleep:  Fair to variable     Treatment Plan Summary:Medication management and Plan as follows   Prior documentation reviewed  1. ADHD: fair continue adderall, take drug holidays 2. PTSD:baseline, Continue celexa, avoid xanax on a regular basis 3. Depression: remains stable. Continue celexa Panic disorder; manageable on xanax  Fu 5 m.  Thresa Ross, MD 11/13/2020, 1:37 PM

## 2021-01-15 ENCOUNTER — Telehealth (HOSPITAL_COMMUNITY): Payer: Self-pay

## 2021-01-15 MED ORDER — AMPHETAMINE-DEXTROAMPHETAMINE 10 MG PO TABS: 10.0000 mg | ORAL_TABLET | Freq: Every day | ORAL | 0 refills | Status: DC

## 2021-01-15 MED ORDER — ALPRAZOLAM 0.5 MG PO TABS: 0.5000 mg | ORAL_TABLET | Freq: Two times a day (BID) | ORAL | 0 refills | Status: DC | PRN

## 2021-01-15 NOTE — Telephone Encounter (Signed)
Patient needs refills on Xanax and Adderall sent to [facility] in [place]

## 2021-02-07 ENCOUNTER — Telehealth (HOSPITAL_COMMUNITY): Payer: Self-pay | Admitting: *Deleted

## 2021-02-07 MED ORDER — ALPRAZOLAM 0.5 MG PO TABS: 0.5000 mg | ORAL_TABLET | Freq: Two times a day (BID) | ORAL | 0 refills | Status: DC | PRN

## 2021-02-07 MED ORDER — AMPHETAMINE-DEXTROAMPHETAMINE 10 MG PO TABS: 10.0000 mg | ORAL_TABLET | Freq: Every day | ORAL | 0 refills | Status: DC

## 2021-02-07 NOTE — Telephone Encounter (Signed)
Pt called  requesting refill ALPRAZolam (XANAX) 0.5 MG tablet  and amphetamine-dextroamphetamine (ADDERALL) 10 MG tablet --send [facility]  Last refill--01/1021 Last VV--11/13/20 Next appt--04/10/21

## 2021-02-07 NOTE — Telephone Encounter (Signed)
[facility], [place]

## 2021-03-21 ENCOUNTER — Telehealth (HOSPITAL_COMMUNITY): Payer: Self-pay

## 2021-03-21 MED ORDER — AMPHETAMINE-DEXTROAMPHETAMINE 10 MG PO TABS: 10.0000 mg | ORAL_TABLET | Freq: Every day | ORAL | 0 refills | Status: DC

## 2021-03-21 NOTE — Telephone Encounter (Signed)
Patient needs a refill on Adderall sent to [facility] in [place]

## 2021-04-10 ENCOUNTER — Telehealth (INDEPENDENT_AMBULATORY_CARE_PROVIDER_SITE_OTHER): Payer: Self-pay | Admitting: Psychiatry

## 2021-04-10 ENCOUNTER — Encounter (HOSPITAL_COMMUNITY): Payer: Self-pay | Admitting: Psychiatry

## 2021-04-10 DIAGNOSIS — F331 Major depressive disorder, recurrent, moderate: Secondary | ICD-10-CM

## 2021-04-10 DIAGNOSIS — F410 Panic disorder [episodic paroxysmal anxiety] without agoraphobia: Secondary | ICD-10-CM

## 2021-04-10 DIAGNOSIS — F900 Attention-deficit hyperactivity disorder, predominantly inattentive type: Secondary | ICD-10-CM

## 2021-04-10 MED ORDER — CITALOPRAM HYDROBROMIDE 10 MG PO TABS
ORAL_TABLET | ORAL | 0 refills | Status: DC
Start: 2021-04-10 — End: 2021-11-07

## 2021-04-10 NOTE — Progress Notes (Signed)
BH MD/PA/NP OP Progress Note  04/10/2021 10:09 AM Morgan Miller  MRN:  [id number].  Virtual Visit via Telephone Note  I connected with Morgan Miller on 04/10/21 at 10:00 AM EST by telephone and verified that I am speaking with the correct person using two identifiers.  Location: Patient: home Provider: office   I discussed the limitations, risks, security and privacy concerns of performing an evaluation and management service by telephone and the availability of in person appointments. I also discussed with the patient that there may be a patient responsible charge related to this service. The patient expressed understanding and agreed to proceed.     I discussed the assessment and treatment plan with the patient. The patient was provided an opportunity to ask questions and all were answered. The patient agreed with the plan and demonstrated an understanding of the instructions.   The patient was advised to call back or seek an in-person evaluation if the symptoms worsen or if the condition fails to improve as anticipated.  I provided 12 minutes of non-face-to-face time during this encounter.    Chief Complaint:  depression follow up   HPI: History of Present Illness:  Patient is a 41 years old [place] American descent married female referred initially  by primary care physician for anxiety depression and possible PTSD. Also been diagnosed with ADHD.  Has joined work at [employer] , different job handling it fair Stress level manageable, prn takes xanax Attention fair   Legal case still pending Gets distracted without adderall  Aggravating factors; past job concern Modifying factor :husband No psychosis Duration: since around 2015    Visit Diagnosis:    ICD-10-CM   1. Moderate episode of recurrent major depressive disorder (HCC)  F33.1     2. Attention deficit hyperactivity disorder (ADHD), predominantly inattentive type  F90.0     3. Panic disorder  F41.0        Past Psychiatric History: depression  Past Medical History:  Past Medical History:  Diagnosis Date   Complication of anesthesia    epidural caused syncope   Hypertension    Preeclampsia in postpartum period     Past Surgical History:  Procedure Laterality Date   CESAREAN SECTION       Family History:  Family History  Problem Relation Age of Onset   Hyperlipidemia Mother    Depression Mother    Anxiety disorder Mother    Diabetes Father    Hyperlipidemia Father    Hypertension Father    Parkinson's disease Father    Alcohol abuse Paternal Uncle    Heart attack Paternal Grandfather     Social History:  Social History   Socioeconomic History   Marital status: Married    Spouse name: Not on file   Number of children: Not on file   Years of education: Not on file   Highest education level: Not on file  Occupational History   Occupation: Librarian, academic  Tobacco Use   Smoking status: Some Days    Packs/day: 0.30    Types: Cigarettes   Smokeless tobacco: Never  Vaping Use   Vaping Use: Never used  Substance and Sexual Activity   Alcohol use: No    Alcohol/week: 2.0 standard drinks    Types: 2 Glasses of wine per week    Comment: Every couple of weeks   Drug use: No   Sexual activity: Yes    Partners: Male  Other Topics Concern   Not on file  Social  History Narrative   Not on file   Social Determinants of Health   Financial Resource Strain: Not on file  Food Insecurity: Not on file  Transportation Needs: Not on file  Physical Activity: Not on file  Stress: Not on file  Social Connections: Not on file    Allergies:  Allergies  Allergen Reactions   Codeine Nausea Only   Nifedipine     fatigue   Penicillins    Sulfamethoxazole Nausea And Vomiting    Metabolic Disorder Labs: Lab Results  Component Value Date   HGBA1C 5.7 (H) 07/14/2015   MPG 117 07/14/2015   MPG 126 (H) 07/25/2014   No results found for: PROLACTIN Lab Results  Component  Value Date   CHOL 193 06/25/2013   TRIG 122 06/25/2013   HDL 48 06/25/2013   CHOLHDL 4.0 06/25/2013   VLDL 24 06/25/2013   LDLCALC 121 (H) 06/25/2013     Current Medications: Current Outpatient Medications  Medication Sig Dispense Refill   ALPRAZolam (XANAX) 0.5 MG tablet Take 1 tablet (0.5 mg total) by mouth 2 (two) times daily as needed for anxiety. 60 tablet 0   amphetamine-dextroamphetamine (ADDERALL) 10 MG tablet Take 1 tablet (10 mg total) by mouth daily. 30 tablet 0   azithromycin (ZITHROMAX Z-PAK) 250 MG tablet Take 2 tablets (500 mg) on  Day 1,  followed by 1 tablet (250 mg) once daily on Days 2 through 5. (Patient not taking: Reported on 08/18/2017) 6 tablet 0   citalopram (CELEXA) 10 MG tablet TAKE 1 AND 1/2 TABLETS DAILY BY MOUTH 135 tablet 0   fluticasone (FLONASE) 50 MCG/ACT nasal spray One spray in each nostril twice a day, use left hand for right nostril, and right hand for left nostril. 48 g 3   Multiple Vitamin (MULTIVITAMIN) tablet Take 1 tablet by mouth daily.     predniSONE (DELTASONE) 50 MG tablet One tab PO daily for 5 days. (Patient not taking: Reported on 08/18/2017) 5 tablet 0   No current facility-administered medications for this visit.      Psychiatric Specialty Exam: Review of Systems  Cardiovascular:  Negative for chest pain.  Psychiatric/Behavioral:  Negative for depression, substance abuse and suicidal ideas.    currently breastfeeding.There is no height or weight on file to calculate BMI.  General Appearance:   Eye Contact:    Speech:  Normal Rate  Volume:  Decreased  Mood:  fair  Affect:    Thought Process:  Goal Directed  Orientation:  Full (Time, Place, and Person)  Thought Content: clear  Suicidal Thoughts:  No  Homicidal Thoughts:  No  Memory:  Immediate;   Fair Recent;   Good  Judgement:  Fair  Insight:  Fair  Psychomotor Activity:  Normal  Concentration:  Concentration: Fair and Attention Span: Fair  Recall:  [employer] of  Knowledge: Fair  Language: Fair  Akathisia:  Negative  Handed:  Right  AIMS (if indicated):    Assets:  Husband support  ADL's:  Intact  Cognition: WNL  Sleep:  Fair to variable     Treatment Plan Summary:Medication management and Plan as follows    Prior documentation reviewed  1. ADHD: managebe on adderall continue 2. PTSD:baseline, Continue celexa, avoid xanax on a regular basis 3. Depression: fair continue celexa, refilsl sent Panic disorder;manageable, takex prn xanax, understadns risk Fu 5 m.  Merian Capron, MD 04/10/2021, 10:09 AM

## 2021-04-19 ENCOUNTER — Telehealth (HOSPITAL_COMMUNITY): Payer: Self-pay

## 2021-04-19 MED ORDER — AMPHETAMINE-DEXTROAMPHETAMINE 10 MG PO TABS: 10.0000 mg | ORAL_TABLET | Freq: Every day | ORAL | 0 refills | Status: DC

## 2021-04-19 NOTE — Telephone Encounter (Signed)
Patient needs a refill on Adderall sent to [facility] in [place]

## 2021-04-25 ENCOUNTER — Emergency Department (INDEPENDENT_AMBULATORY_CARE_PROVIDER_SITE_OTHER)
Admission: EM | Admit: 2021-04-25 | Discharge: 2021-04-25 | Disposition: A | Discharge status: Home / Self Care | Payer: Self-pay | Source: Home / Self Care

## 2021-04-25 ENCOUNTER — Other Ambulatory Visit: Payer: Self-pay

## 2021-04-25 DIAGNOSIS — J111 Influenza due to unidentified influenza virus with other respiratory manifestations: Secondary | ICD-10-CM

## 2021-04-25 DIAGNOSIS — R059 Cough, unspecified: Secondary | ICD-10-CM

## 2021-04-25 LAB — POCT INFLUENZA A/B
Influenza A, POC: NEGATIVE
Influenza B, POC: NEGATIVE

## 2021-04-25 LAB — POC SARS CORONAVIRUS 2 AG -  ED: SARS Coronavirus 2 Ag: NEGATIVE

## 2021-04-25 MED ORDER — BENZONATATE 200 MG PO CAPS: 200.0000 mg | ORAL_CAPSULE | Freq: Three times a day (TID) | ORAL | 0 refills | Status: AC | PRN

## 2021-04-25 MED ORDER — OSELTAMIVIR PHOSPHATE 75 MG PO CAPS: 75.0000 mg | ORAL_CAPSULE | Freq: Two times a day (BID) | ORAL | 0 refills | Status: DC

## 2021-04-25 NOTE — Discharge Instructions (Addendum)
Advised/inform patient that Influenza A/B was negative as well as COVID-19.  Advised patient to take medication as directed with food to completion.  Advised patient may take Tessalon Perles daily or as needed for cough.  Encouraged increased daily water intake while taking these medications.

## 2021-04-25 NOTE — ED Triage Notes (Signed)
Pt states that she has been running a fever, chills, body aches, nausea, and fatigue. X2 days  Pt states that she had a negative covid test 1/17.  Pt states that she isn't vaccinated for covid. Pt states that she hasn't had flu vaccine.

## 2021-04-25 NOTE — ED Provider Notes (Signed)
[facility]    CSN: [id number] Arrival date & time: 04/25/21  1828      History   Chief Complaint Chief Complaint  Patient presents with   Fever    Fever, chills, body aches, nausea and fatigue. X2 days    HPI Morgan Miller is a 41 y.o. female.   HPI 41 year old female presents with flulike symptoms (fever, chills, body aches, nausea and fatigue) for 2 days.  Patient reports negative home COVID 19 test yesterday Wednesday, 04/24/2021 and reports she is currently not vaccinated for influenza.   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   Moderate episode of recurrent major depressive disorder (HCC) 04/12/2015   Insomnia 03/07/2015   Emotional crisis, acute reaction to stress 03/07/2015   Overweight (BMI 25.0-29.9) 08/10/2014   Anxiety in acute stress reaction 08/10/2014   Miscarriage 08/09/2014   Abnormal pregnancy in first trimester 07/26/2014   Pre-diabetes 06/29/2013   Essential hypertension-postpartum 06/21/2013   Abnormal weight gain 06/21/2013   BMI 29.0-29.9,adult 06/21/2013   Endometriosis 08/30/2009    Past Surgical History:  Procedure Laterality Date   CESAREAN SECTION      OB History     Gravida  5   Para  2   Term  2   Preterm      AB  1   Living  2      SAB  1   IAB      Ectopic      Multiple      Live Births  2            Home  Medications    Prior to Admission medications   Medication Sig Start Date End Date Taking? Authorizing Provider  ALPRAZolam Prudy Feeler) 0.5 MG tablet Take 1 tablet (0.5 mg total) by mouth 2 (two) times daily as needed for anxiety. 02/07/21  Yes Thresa Ross, MD  amphetamine-dextroamphetamine (ADDERALL) 10 MG tablet Take 1 tablet (10 mg total) by mouth daily. 04/19/21 04/19/22 Yes Thresa Ross, MD  benzonatate (TESSALON) 200 MG capsule Take 1 capsule (200 mg total) by mouth 3 (three) times daily as needed for up to 7 days for cough. 04/25/21 05/02/21 Yes Trevor Iha, FNP  citalopram (CELEXA) 10 MG tablet TAKE 1 AND 1/2 TABLETS DAILY BY MOUTH 04/10/21  Yes Thresa Ross, MD  fluticasone (FLONASE) 50 MCG/ACT nasal spray One spray in each nostril twice a day, use left hand for right nostril, and right hand for left nostril. 09/20/16  Yes Monica Becton, MD  Multiple Vitamin (MULTIVITAMIN) tablet Take 1 tablet by mouth daily.   Yes [provider]  oseltamivir (TAMIFLU) 75 MG capsule Take 1 capsule (75 mg total) by mouth every 12 (twelve) hours. 04/25/21  Yes Trevor Iha, FNP  azithromycin (ZITHROMAX Z-PAK) 250 MG tablet Take 2 tablets (500 mg) on  Day 1,  followed by 1 tablet (250 mg) once daily on Days  2 through 5. Patient not taking: Reported on 08/18/2017 09/18/16   Monica Bectonhekkekandam, Thomas J, MD  predniSONE (DELTASONE) 50 MG tablet One tab PO daily for 5 days. Patient not taking: Reported on 08/18/2017 09/18/16   Monica Bectonhekkekandam, Thomas J, MD  FLUoxetine (PROZAC) 20 MG tablet Take 1 tablet (20 mg total) by mouth daily. Patient not taking: Reported on 03/17/2015 03/06/15 03/17/15  Jomarie LongsBreeback, Jade L, PA-C    Family History Family History  Problem Relation Age of Onset   Hyperlipidemia Mother    Depression Mother    Anxiety disorder Mother    Diabetes Father    Hyperlipidemia Father    Hypertension Father    Parkinson's disease Father    Alcohol abuse Paternal Uncle    Heart attack  Paternal Grandfather     Social History Social History   Tobacco Use   Smoking status: Some Days    Packs/day: 0.30    Types: Cigarettes   Smokeless tobacco: Never  Vaping Use   Vaping Use: Never used  Substance Use Topics   Alcohol use: No    Alcohol/week: 2.0 standard drinks    Types: 2 Glasses of wine per week    Comment: Every couple of weeks   Drug use: No     Allergies   Codeine, Nifedipine, Penicillins, and Sulfamethoxazole   Review of Systems Review of Systems  Constitutional:  Positive for chills, fatigue and fever.  Gastrointestinal:  Positive for nausea.  Musculoskeletal:  Positive for myalgias.  All other systems reviewed and are negative.   Physical Exam Triage Vital Signs ED Triage Vitals  Enc Vitals Group     BP      Pulse      Resp      Temp      Temp src      SpO2      Weight      Height      Head Circumference      Peak Flow      Pain Score      Pain Loc      Pain Edu?      Excl. in GC?    No data found.  Updated Vital Signs BP (!) 148/97 (BP Location: Left Arm)    Pulse 94    Temp 98.4 F (36.9 C) (Oral)    Resp 20    Ht 5\' 7"  (1.702 m)    Wt 168 lb (76.2 kg)    LMP 03/13/2021    SpO2 97%    BMI 26.31 kg/m      Physical Exam Vitals and nursing note reviewed.  Constitutional:      Appearance: Normal appearance. She is obese.  HENT:     Right Ear: Tympanic membrane, ear canal and external ear normal.     Left Ear: Tympanic membrane, ear canal and external ear normal.     Mouth/Throat:     Mouth: Mucous membranes are moist.     Pharynx: Oropharynx is clear.  Eyes:     Extraocular Movements: Extraocular movements intact.     Conjunctiva/sclera: Conjunctivae normal.     Pupils: Pupils are equal, round, and reactive to light.  Cardiovascular:     Rate and Rhythm: Normal rate and regular rhythm.     Pulses: Normal pulses.     Heart sounds: Normal heart sounds.  Pulmonary:     Effort: Pulmonary effort is normal.     Breath  sounds: Normal breath sounds.  Musculoskeletal:  Cervical back: Normal range of motion and neck supple.  Skin:    General: Skin is warm and dry.  Neurological:     General: No focal deficit present.     Mental Status: She is alert and oriented to person, place, and time. Mental status is at baseline.     UC Treatments / Results  Labs (all labs ordered are listed, but only abnormal results are displayed) Labs Reviewed  POC SARS CORONAVIRUS 2 AG -  ED - Normal  POCT INFLUENZA A/B - Normal    EKG   Radiology No results found.  Procedures Procedures (including critical care time)  Medications Ordered in UC Medications - No data to display  Initial Impression / Assessment and Plan / UC Course  I have reviewed the triage vital signs and the nursing notes.  Pertinent labs & imaging results that were available during my care of the patient were reviewed by me and considered in my medical decision making (see chart for details).     MDM: 1.  Influenza-like illness-influenza A/B and COVID-19 were negative this evening; will treat empirically with Tamiflu; 2. Cough-Rx'd Tessalon Perles. Advised/inform patient that Influenza A/B was negative as well as COVID-19.  Advised patient to take medication as directed with food to completion.  Advised patient may take Tessalon Perles daily or as needed for cough.  Encouraged increased daily water intake while taking these medications.  Work note provided per patient request prior to discharge.  Patient discharged home, hemodynamically stable. Final Clinical Impressions(s) / UC Diagnoses   Final diagnoses:  Influenza-like illness  Cough, unspecified type     Discharge Instructions      Advised/inform patient that Influenza A/B was negative as well as COVID-19.  Advised patient to take medication as directed with food to completion.  Advised patient may take Tessalon Perles daily or as needed for cough.  Encouraged increased daily water  intake while taking these medications.      ED Prescriptions     Medication Sig Dispense Auth. Provider   oseltamivir (TAMIFLU) 75 MG capsule Take 1 capsule (75 mg total) by mouth every 12 (twelve) hours. 10 capsule Trevor Iha, FNP   benzonatate (TESSALON) 200 MG capsule Take 1 capsule (200 mg total) by mouth 3 (three) times daily as needed for up to 7 days for cough. 40 capsule Trevor Iha, FNP      PDMP not reviewed this encounter.   Trevor Iha, FNP 04/25/21 1936

## 2021-05-30 ENCOUNTER — Telehealth (HOSPITAL_COMMUNITY): Payer: Self-pay | Admitting: Psychiatry

## 2021-05-30 MED ORDER — AMPHETAMINE-DEXTROAMPHETAMINE 10 MG PO TABS: 10.0000 mg | ORAL_TABLET | Freq: Every day | ORAL | 0 refills | Status: DC

## 2021-05-30 NOTE — Telephone Encounter (Signed)
Per pt- needs refill on Adderall  [facility] in [place]

## 2021-07-05 ENCOUNTER — Telehealth (HOSPITAL_COMMUNITY): Payer: Self-pay

## 2021-07-05 MED ORDER — AMPHETAMINE-DEXTROAMPHETAMINE 10 MG PO TABS: 10.0000 mg | ORAL_TABLET | Freq: Every day | ORAL | 0 refills | Status: DC

## 2021-07-05 NOTE — Telephone Encounter (Signed)
Patient needs a refill on Adderall sent to [facility] in [place]

## 2021-08-09 ENCOUNTER — Other Ambulatory Visit: Payer: Self-pay | Admitting: Psychiatry

## 2021-08-09 ENCOUNTER — Telehealth (HOSPITAL_COMMUNITY): Payer: Self-pay | Admitting: Psychiatry

## 2021-08-09 MED ORDER — AMPHETAMINE-DEXTROAMPHETAMINE 10 MG PO TABS: 10.0000 mg | ORAL_TABLET | Freq: Every day | ORAL | 0 refills | Status: DC

## 2021-08-09 NOTE — Telephone Encounter (Signed)
Per pt  ?Needs refill on Adderall  ?[facility] in [place]  ? ?Has apt in July for follow up ?

## 2021-08-09 NOTE — Telephone Encounter (Signed)
Ordered. Will defer further refill to Dr. Gilmore Laroche. ?I have utilized the [place] Controlled Substances Reporting System (PMP AWARxE) to confirm adherence regarding the patient's medication. My review reveals appropriate prescription fills.

## 2021-08-13 ENCOUNTER — Telehealth (HOSPITAL_COMMUNITY): Payer: Self-pay

## 2021-08-13 MED ORDER — AMPHETAMINE-DEXTROAMPHETAMINE 10 MG PO TABS: 10.0000 mg | ORAL_TABLET | Freq: Every day | ORAL | 0 refills | Status: DC

## 2021-08-13 NOTE — Telephone Encounter (Signed)
Adderall was sent to wrong pharmacy. It was supposed to be sent to [facility] in [place] ?

## 2021-09-10 ENCOUNTER — Telehealth (HOSPITAL_COMMUNITY): Payer: Self-pay

## 2021-09-10 ENCOUNTER — Other Ambulatory Visit (HOSPITAL_COMMUNITY): Payer: Self-pay | Admitting: Psychiatry

## 2021-09-10 MED ORDER — AMPHETAMINE-DEXTROAMPHETAMINE 10 MG PO TABS: 10.0000 mg | ORAL_TABLET | Freq: Every day | ORAL | 0 refills | Status: DC

## 2021-09-10 NOTE — Telephone Encounter (Signed)
Medication refill -Telephone call with patient to inform Dr. Milana Kidney had sent in her requested new Adderall 10 mg order to her [facility] located on [address] [place]. Pt to call back if any issues filling medication.

## 2021-09-10 NOTE — Telephone Encounter (Signed)
Medication management - Telephone call with patient, after she left a message she is in need of a new Adderall 10 mg order, last provided on 08/13/21 and patient would like this sent to the [facility] on [address]  Patient returns 10/18/21. Informed patient Dr. De Nurse was off this week but would send request to covering provider.

## 2021-09-10 NOTE — Telephone Encounter (Signed)
sent 

## 2021-09-10 NOTE — Telephone Encounter (Signed)
Medication management - Telephone call with pt's [facility], after completing patient's new prior authorization for her prescribed Adderall 10 mg tablets.  PA approved through 09/10/2022.  Pharmacist verified the prescription was now being filled as ordered.  [phone] and Patient ID # [id number].

## 2021-10-12 ENCOUNTER — Telehealth (HOSPITAL_COMMUNITY): Payer: Self-pay

## 2021-10-12 MED ORDER — AMPHETAMINE-DEXTROAMPHETAMINE 10 MG PO TABS: 10.0000 mg | ORAL_TABLET | Freq: Every day | ORAL | 0 refills | Status: DC

## 2021-10-12 NOTE — Telephone Encounter (Signed)
Patient needs a refill on Adderall sent to [facility] in [place] Last refill 06/05 Next appt 07/13

## 2021-10-18 ENCOUNTER — Ambulatory Visit (HOSPITAL_COMMUNITY): Payer: Self-pay | Admitting: Psychiatry

## 2021-11-07 ENCOUNTER — Encounter (HOSPITAL_COMMUNITY): Payer: Self-pay | Admitting: Psychiatry

## 2021-11-07 ENCOUNTER — Telehealth (INDEPENDENT_AMBULATORY_CARE_PROVIDER_SITE_OTHER): Payer: Self-pay | Admitting: Psychiatry

## 2021-11-07 DIAGNOSIS — F900 Attention-deficit hyperactivity disorder, predominantly inattentive type: Secondary | ICD-10-CM

## 2021-11-07 DIAGNOSIS — F4310 Post-traumatic stress disorder, unspecified: Secondary | ICD-10-CM

## 2021-11-07 DIAGNOSIS — F331 Major depressive disorder, recurrent, moderate: Secondary | ICD-10-CM

## 2021-11-07 MED ORDER — AMPHETAMINE-DEXTROAMPHETAMINE 10 MG PO TABS: 10.0000 mg | ORAL_TABLET | Freq: Every day | ORAL | 0 refills | Status: DC

## 2021-11-07 MED ORDER — CITALOPRAM HYDROBROMIDE 10 MG PO TABS: ORAL_TABLET | ORAL | 0 refills | Status: DC

## 2021-11-07 NOTE — Progress Notes (Signed)
BH MD/PA/NP OP Progress Note  11/07/2021 10:13 AM Morgan Miller  MRN:  [id number].  Virtual Visit via Video Note  I connected with Morgan Miller on 11/07/21 at 10:00 AM EDT by a video enabled telemedicine application and verified that I am speaking with the correct person using two identifiers.  Location: Patient: parked car Provider: home office   I discussed the limitations of evaluation and management by telemedicine and the availability of in person appointments. The patient expressed understanding and agreed to proceed.      I discussed the assessment and treatment plan with the patient. The patient was provided an opportunity to ask questions and all were answered. The patient agreed with the plan and demonstrated an understanding of the instructions.   The patient was advised to call back or seek an in-person evaluation if the symptoms worsen or if the condition fails to improve as anticipated.  I provided 15  minutes of non-face-to-face time during this encounter.      Chief Complaint:  depression follow up   HPI: History of Present Illness:  Patient is a 42 years old [place] American descent married female referred initially  by primary care physician for anxiety depression and possible PTSD. Also been diagnosed with ADHD.  Doing fair, now seperated for nearly an year and going on for the better. Have not told kids yet and slowly transitioning She won the legal case with [employer], less past flashbacks and moving on.  Got settlement Starting a new location job with [employer]  Less stressed Attention manageable on adderall Seldom takes xanax now   Aggravating factors; past job concern, relationship but now seperating Modifying factor : parents, kids No psychosis Duration: since around 2015 Severity improved   Visit Diagnosis:    ICD-10-CM   1. Moderate episode of recurrent major depressive disorder (HCC)  F33.1     2. Attention deficit hyperactivity disorder  (ADHD), predominantly inattentive type  F90.0     3. PTSD (post-traumatic stress disorder)  F43.10       Past Psychiatric History: depression  Past Medical History:  Past Medical History:  Diagnosis Date   Complication of anesthesia    epidural caused syncope   Hypertension    Preeclampsia in postpartum period     Past Surgical History:  Procedure Laterality Date   CESAREAN SECTION       Family History:  Family History  Problem Relation Age of Onset   Hyperlipidemia Mother    Depression Mother    Anxiety disorder Mother    Diabetes Father    Hyperlipidemia Father    Hypertension Father    Parkinson's disease Father    Alcohol abuse Paternal Uncle    Heart attack Paternal Grandfather     Social History:  Social History   Socioeconomic History   Marital status: Married    Spouse name: Not on file   Number of children: Not on file   Years of education: Not on file   Highest education level: Not on file  Occupational History   Occupation: Merchandiser, retail  Tobacco Use   Smoking status: Some Days    Packs/day: 0.30    Types: Cigarettes   Smokeless tobacco: Never  Vaping Use   Vaping Use: Never used  Substance and Sexual Activity   Alcohol use: No    Alcohol/week: 2.0 standard drinks of alcohol    Types: 2 Glasses of wine per week    Comment: Every couple of weeks   Drug  use: No   Sexual activity: Yes    Partners: Male  Other Topics Concern   Not on file  Social History Narrative   Not on file   Social Determinants of Health   Financial Resource Strain: Not on file  Food Insecurity: Not on file  Transportation Needs: Not on file  Physical Activity: Not on file  Stress: Not on file  Social Connections: Not on file    Allergies:  Allergies  Allergen Reactions   Codeine Nausea Only   Nifedipine     fatigue   Penicillins    Sulfamethoxazole Nausea And Vomiting    Metabolic Disorder Labs: Lab Results  Component Value Date   HGBA1C 5.7 (H)  07/14/2015   MPG 117 07/14/2015   MPG 126 (H) 07/25/2014   No results found for: "PROLACTIN" Lab Results  Component Value Date   CHOL 193 06/25/2013   TRIG 122 06/25/2013   HDL 48 06/25/2013   CHOLHDL 4.0 06/25/2013   VLDL 24 06/25/2013   LDLCALC 121 (H) 06/25/2013     Current Medications: Current Outpatient Medications  Medication Sig Dispense Refill   ALPRAZolam (XANAX) 0.5 MG tablet Take 1 tablet (0.5 mg total) by mouth 2 (two) times daily as needed for anxiety. 60 tablet 0   amphetamine-dextroamphetamine (ADDERALL) 10 MG tablet Take 1 tablet (10 mg total) by mouth daily. 30 tablet 0   azithromycin (ZITHROMAX Z-PAK) 250 MG tablet Take 2 tablets (500 mg) on  Day 1,  followed by 1 tablet (250 mg) once daily on Days 2 through 5. (Patient not taking: Reported on 08/18/2017) 6 tablet 0   citalopram (CELEXA) 10 MG tablet Take one a day 90 tablet 0   fluticasone (FLONASE) 50 MCG/ACT nasal spray One spray in each nostril twice a day, use left hand for right nostril, and right hand for left nostril. 48 g 3   Multiple Vitamin (MULTIVITAMIN) tablet Take 1 tablet by mouth daily.     oseltamivir (TAMIFLU) 75 MG capsule Take 1 capsule (75 mg total) by mouth every 12 (twelve) hours. 10 capsule 0   predniSONE (DELTASONE) 50 MG tablet One tab PO daily for 5 days. (Patient not taking: Reported on 08/18/2017) 5 tablet 0   No current facility-administered medications for this visit.      Psychiatric Specialty Exam: Review of Systems  Cardiovascular:  Negative for chest pain.  Neurological:  Negative for tremors.  Psychiatric/Behavioral:  Negative for depression, substance abuse and suicidal ideas.     currently breastfeeding.There is no height or weight on file to calculate BMI.  General Appearance: neat  Eye Contact:  good  Speech:  Normal Rate  Volume:  Decreased  Mood:  fair  Affect:    Thought Process:  Goal Directed  Orientation:  Full (Time, Place, and Person)  Thought Content:  clear  Suicidal Thoughts:  No  Homicidal Thoughts:  No  Memory:  Immediate;   Fair Recent;   Good  Judgement:  Fair  Insight:  Fair  Psychomotor Activity:  Normal  Concentration:  Concentration: Fair and Attention Span: Fair  Recall:  [employer] of Knowledge: Fair  Language: Fair  Akathisia:  Negative  Handed:  Right  AIMS (if indicated):    Assets:  Husband support  ADL's:  Intact  Cognition: WNL  Sleep:  Fair to variable     Treatment Plan Summary:Medication management and Plan as follows    Prior documentation reviewed  1. ADHD: fair on adderall, discussed to  take drug holidays, no side effects 2. PTSD:baseline or improved, continue celexa now taking 10mg  3. Depression:feels doing well continue celexa  Panic disorder;manageable, takex prn xanax, understadns risk, has not had refill for a while now Fu 6  m.  , MD 11/07/2021, 10:13 AM

## 2021-11-13 ENCOUNTER — Ambulatory Visit (HOSPITAL_COMMUNITY): Payer: Self-pay | Admitting: Psychiatry

## 2021-12-03 ENCOUNTER — Telehealth (HOSPITAL_COMMUNITY): Payer: Self-pay

## 2021-12-03 MED ORDER — AMPHETAMINE-DEXTROAMPHETAMINE 10 MG PO TABS: 10.0000 mg | ORAL_TABLET | Freq: Every day | ORAL | 0 refills | Status: DC

## 2021-12-03 NOTE — Telephone Encounter (Signed)
Patient called to get a refill on Adderall sent to [facility] in [place] Last refill 08/02 Next appt 05/15/22

## 2021-12-31 ENCOUNTER — Telehealth (HOSPITAL_COMMUNITY): Payer: Self-pay

## 2021-12-31 ENCOUNTER — Other Ambulatory Visit (HOSPITAL_COMMUNITY): Payer: Self-pay | Admitting: Psychiatry

## 2021-12-31 MED ORDER — AMPHETAMINE-DEXTROAMPHETAMINE 10 MG PO TABS: 10.0000 mg | ORAL_TABLET | Freq: Every day | ORAL | 0 refills | Status: DC

## 2021-12-31 NOTE — Telephone Encounter (Signed)
Patient is requesting a refill on Adderall. [facility] in [place] Last refill 08/28 Next appt 05/15/22

## 2021-12-31 NOTE — Telephone Encounter (Signed)
sent 

## 2022-01-02 ENCOUNTER — Telehealth (HOSPITAL_COMMUNITY): Payer: Self-pay

## 2022-01-02 NOTE — Telephone Encounter (Signed)
Patient wants an increase on Adderall. She says she is working 7 days a week and going thru a divorce. She thought that you increased it last month when you sent the 60 tabs and she took 2 a day. She wants 2 a day Adderall. Please advise.   Patient CB# [phone]

## 2022-01-02 NOTE — Telephone Encounter (Signed)
Patient informed. She stated her understanding.

## 2022-01-18 ENCOUNTER — Ambulatory Visit (INDEPENDENT_AMBULATORY_CARE_PROVIDER_SITE_OTHER): Payer: Federal, State, Local not specified - PPO | Admitting: Podiatrist

## 2022-01-18 DIAGNOSIS — T148XXA Other injury of unspecified body region, initial encounter: Secondary | ICD-10-CM | POA: Diagnosis not present

## 2022-01-18 NOTE — Progress Notes (Signed)
Chief Complaint  Patient presents with   Plantar Warts    Pain bottom of right foot-  been going on since august.      HPI: Patient is 41 y.o. female who presents today for pain plantar lateral of the right foot. She relates she went to water parks this summer with her kids and she thinks she may have picked up a wart from being there. She has tried treating it with salicylic acid patches and states she has noticed some improvement.   PMH and meds reviewed   Allergies  Allergen Reactions   Codeine Nausea Only   Nifedipine     fatigue   Penicillins    Sulfamethoxazole Nausea And Vomiting    Review of Systems No fevers, chills, nausea, muscle aches, no difficulty breathing, no calf pain, no chest pain or shortness of breath.   Physical Exam  GENERAL APPEARANCE: Alert, conversant. Appropriately groomed. No acute distress.   VASCULAR: Pedal pulses palpable 2/4 DP and PT bilateral.  Capillary refill time is immediate to all digits,  Proximal to distal cooling is warm to warm.  Digital perfusion adequate.   NEUROLOGIC: sensation is intact to 5.07 monofilament at 5/5 sites bilateral.  Light touch is intact bilateral, vibratory sensation intact bilateral  MUSCULOSKELETAL: acceptable muscle strength, tone and stability bilateral.  No gross boney pedal deformities noted.  No pain, crepitus or limitation noted with foot and ankle range of motion bilateral.   DERMATOLOGIC: skin is warm, supple, and dry.  Color, texture, and turgor of skin within normal limits.  Plantar lateral heel of right foot is a well circumscibed lesion present.  Skin tension lines are noted throughout.  Once skin is pared, a small hair (dog hair presumably) is present deep in the dermis of her foot.  This is removed and appears to be the cause of her discomfort.     Assessment     ICD-10-CM   1. Foreign body/splinter, skin  T14.8XXA        Plan  I removed the hair within the skin of the foot with a pick up.   It was deep in the dermis and was removed completely.  I applied antibiotic ointment and a bandaid. If the pain fails to resolve she will call.

## 2022-01-29 ENCOUNTER — Encounter: Payer: Self-pay | Admitting: Podiatrist

## 2022-05-01 DIAGNOSIS — F909 Attention-deficit hyperactivity disorder, unspecified type: Secondary | ICD-10-CM | POA: Diagnosis not present

## 2022-05-15 ENCOUNTER — Telehealth (HOSPITAL_COMMUNITY): Payer: Federal, State, Local not specified - PPO | Admitting: Psychiatry

## 2022-05-15 ENCOUNTER — Encounter (HOSPITAL_COMMUNITY): Payer: Self-pay

## 2022-07-24 DIAGNOSIS — F909 Attention-deficit hyperactivity disorder, unspecified type: Secondary | ICD-10-CM | POA: Diagnosis not present

## 2022-08-07 IMAGING — MG DIGITAL SCREENING BILAT W/ TOMO W/ CAD
8 series · 8 of 24 positions shown · non-contrast
Comparison: None.

CLINICAL DATA: Screening.

EXAM:
DIGITAL SCREENING BILATERAL MAMMOGRAM WITH TOMO AND CAD

[R CC synth-2D]
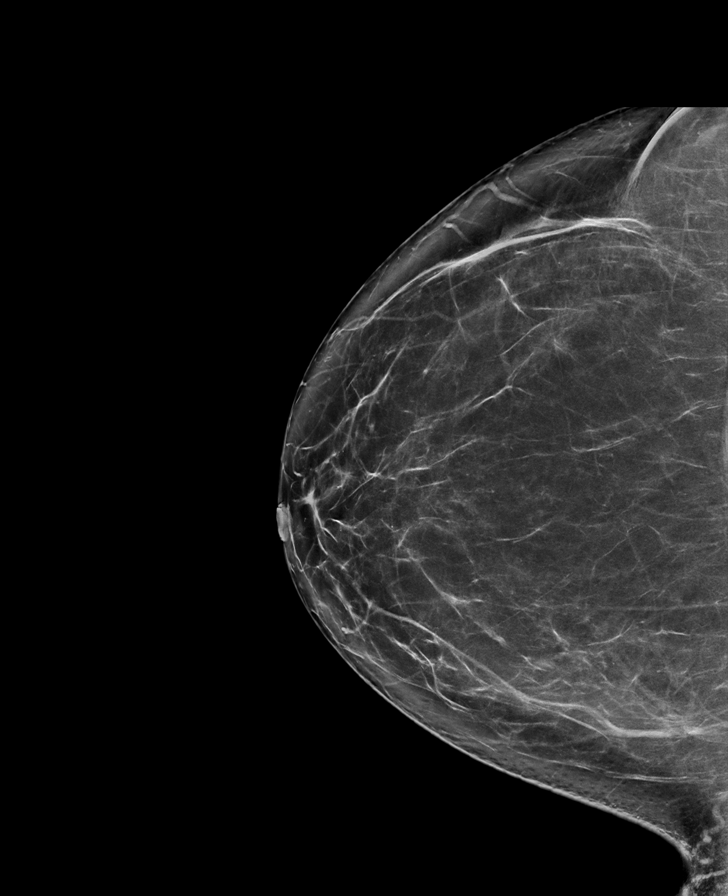

[L CC synth-2D]
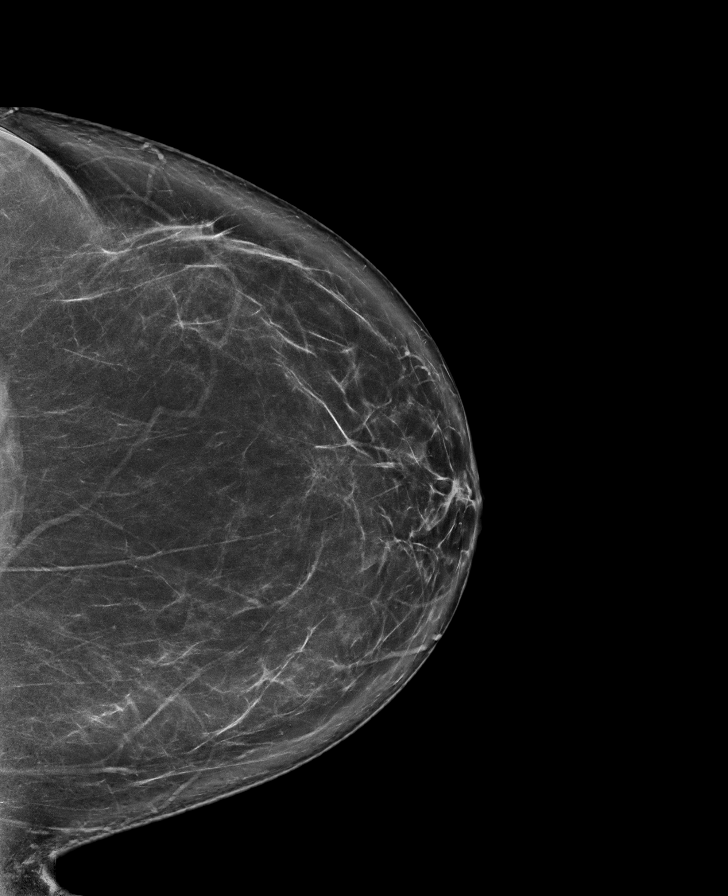

[L MLO synth-2D]
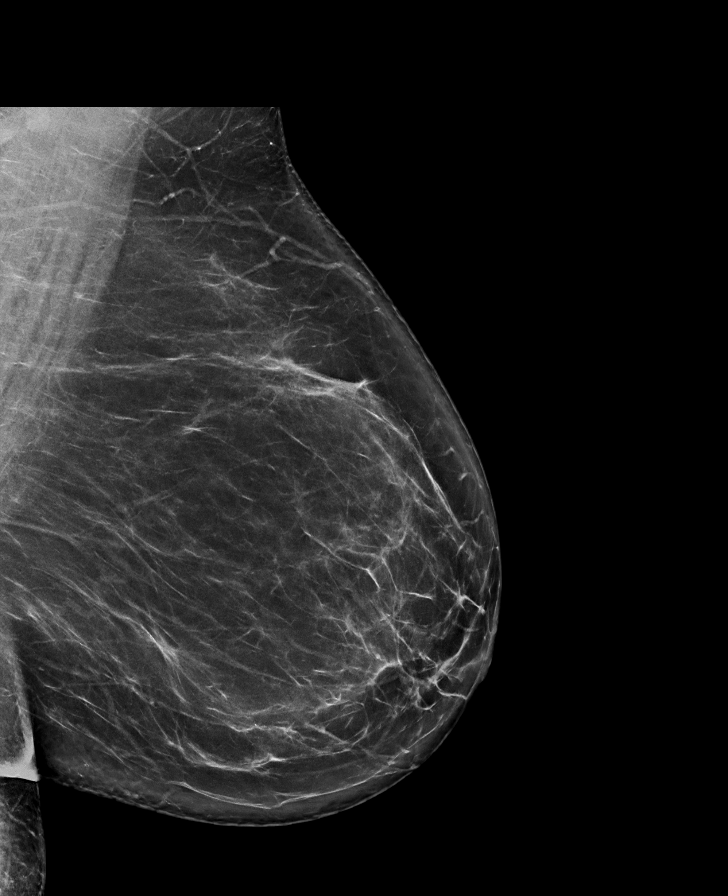

[R MLO synth-2D]
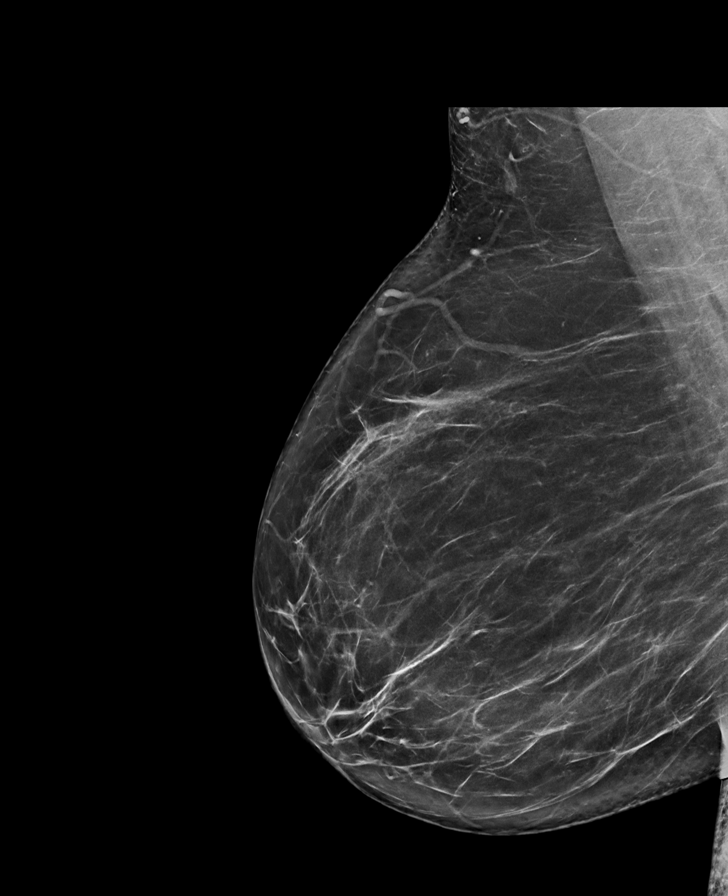

[L CC tomo · tomo slice 45/88.0]
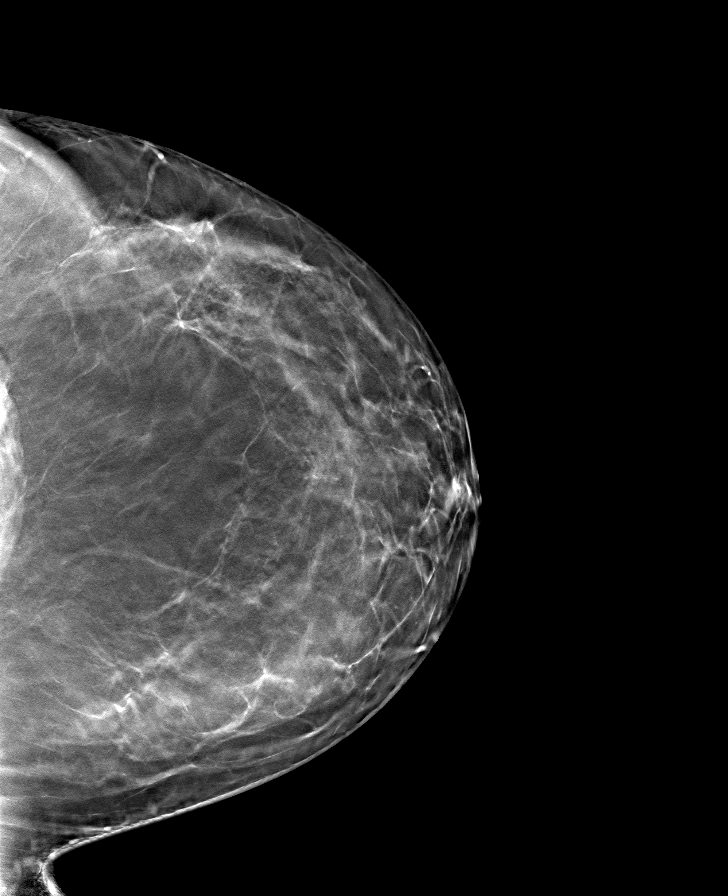

[R MLO tomo · tomo slice 45/89.0]
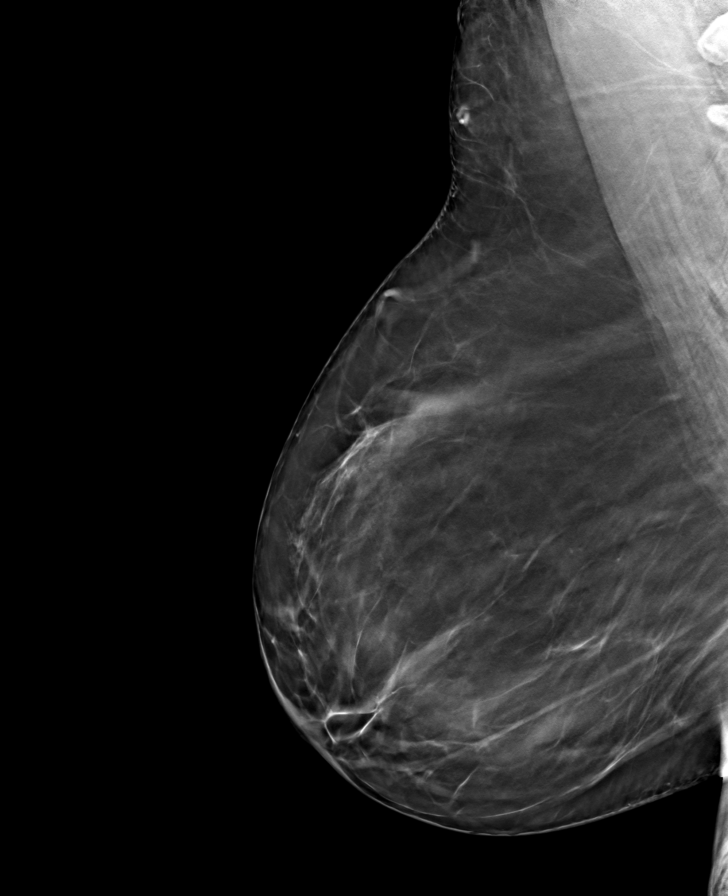

[L MLO tomo · tomo slice 45/90.0]
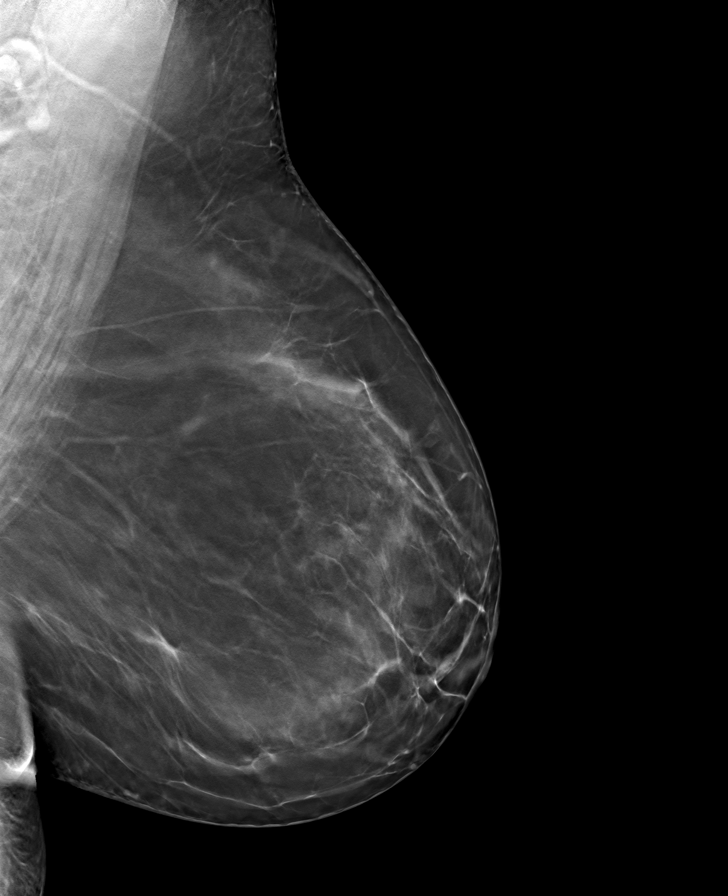

[R CC tomo · tomo slice 43/85.0]
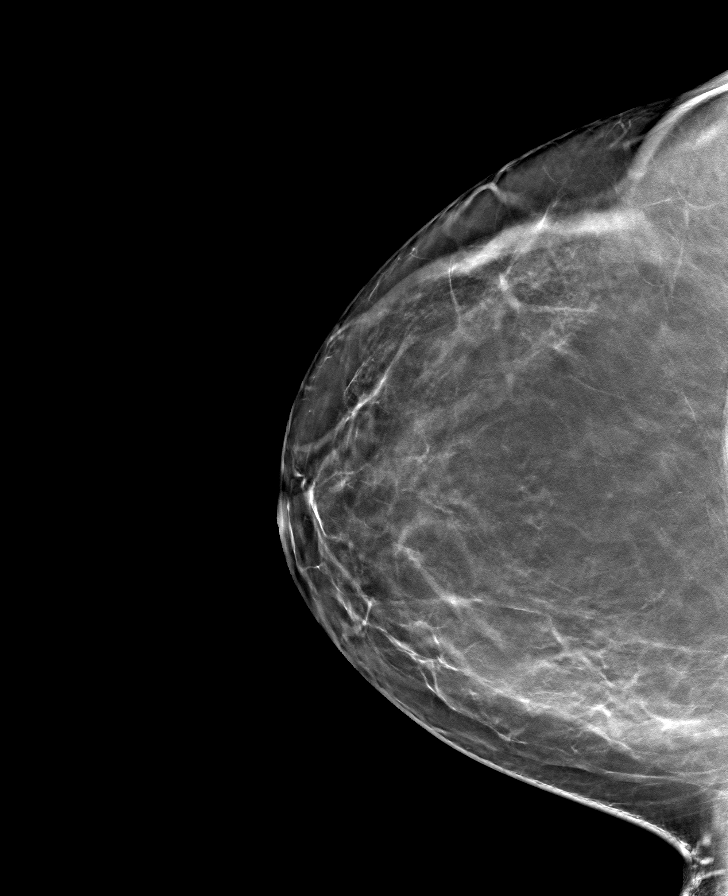

[8 of 24 positions shown; findings below may reference images not displayed]

ACR Breast Density Category b: There are scattered areas of
fibroglandular density.
FINDINGS: There are no findings suspicious for malignancy. Images were
processed with CAD.
IMPRESSION: No mammographic evidence of malignancy. A result letter of this
screening mammogram will be mailed directly to the patient.

RECOMMENDATION:
Screening mammogram in one year. (Code:Y5-G-EJ6)

BI-RADS CATEGORY  1: Negative.

## 2022-10-02 DIAGNOSIS — H1013 Acute atopic conjunctivitis, bilateral: Secondary | ICD-10-CM | POA: Diagnosis not present

## 2022-10-03 DIAGNOSIS — Z653 Problems related to other legal circumstances: Secondary | ICD-10-CM | POA: Diagnosis not present

## 2022-10-03 DIAGNOSIS — R6889 Other general symptoms and signs: Secondary | ICD-10-CM | POA: Diagnosis not present

## 2022-10-03 DIAGNOSIS — Z0289 Encounter for other administrative examinations: Secondary | ICD-10-CM | POA: Diagnosis not present

## 2022-10-09 DIAGNOSIS — H1013 Acute atopic conjunctivitis, bilateral: Secondary | ICD-10-CM | POA: Diagnosis not present

## 2022-10-15 DIAGNOSIS — F909 Attention-deficit hyperactivity disorder, unspecified type: Secondary | ICD-10-CM | POA: Diagnosis not present

## 2022-11-09 ENCOUNTER — Ambulatory Visit
Admission: EM | Admit: 2022-11-09 | Discharge: 2022-11-09 | Disposition: A | Discharge status: Home / Self Care | Payer: Federal, State, Local not specified - PPO | Attending: Family Medicine | Admitting: Family Medicine

## 2022-11-09 DIAGNOSIS — R319 Hematuria, unspecified: Secondary | ICD-10-CM | POA: Diagnosis not present

## 2022-11-09 DIAGNOSIS — R300 Dysuria: Secondary | ICD-10-CM | POA: Diagnosis not present

## 2022-11-09 DIAGNOSIS — N390 Urinary tract infection, site not specified: Secondary | ICD-10-CM | POA: Diagnosis not present

## 2022-11-09 LAB — POCT URINALYSIS DIP (MANUAL ENTRY)
Bilirubin, UA: NEGATIVE
Glucose, UA: NEGATIVE mg/dL
Ketones, POC UA: NEGATIVE mg/dL
Nitrite, UA: NEGATIVE
Protein Ur, POC: 30 mg/dL — AB
Spec Grav, UA: 1.015 (ref 1.010–1.025)
Urobilinogen, UA: 0.2 E.U./dL
pH, UA: 6 (ref 5.0–8.0)

## 2022-11-09 LAB — POCT URINE PREGNANCY: Preg Test, Ur: NEGATIVE

## 2022-11-09 MED ORDER — NITROFURANTOIN MONOHYD MACRO 100 MG PO CAPS: 100.0000 mg | ORAL_CAPSULE | Freq: Two times a day (BID) | ORAL | 0 refills | Status: DC

## 2022-11-09 NOTE — ED Triage Notes (Signed)
Pt presents to [facility] with co of hematuria and dysuria since last night. Pt is concerned for uti. Pt has not taken any otc meds

## 2022-11-09 NOTE — Discharge Instructions (Addendum)
Take the macrobid ( nitrofurantoin) 2 x a day  You will be called if any change in antibiotics are needed

## 2022-11-09 NOTE — ED Provider Notes (Signed)
[facility]    CSN: [id number] Arrival date & time: 11/09/22  7829      History   Chief Complaint Chief Complaint  Patient presents with   Dysuria   Hematuria   Urinary Frequency    HPI Morgan Miller is a 42 y.o. female.   Patient states she is never had a bladder infection.  Woke up this morning with dysuria.  She states that she is urinating frequently.  At the end of urination she has terminal pain.  Some suprapubic fullness.  No flank pain.  No nausea or vomiting.  No kidney stones or kidney infection in the past.    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   Moderate episode of recurrent major depressive disorder (HCC) 04/12/2015   Insomnia 03/07/2015   Emotional crisis, acute reaction to stress 03/07/2015   Overweight (BMI 25.0-29.9) 08/10/2014   Anxiety in acute stress reaction 08/10/2014   Miscarriage 08/09/2014   Abnormal pregnancy in first trimester 07/26/2014   Pre-diabetes 06/29/2013   Essential hypertension-postpartum 06/21/2013   Abnormal weight gain 06/21/2013   BMI 29.0-29.9,adult 06/21/2013   Endometriosis 08/30/2009    Past Surgical History:  Procedure Laterality Date   CESAREAN SECTION      OB History     Gravida  5   Para  2   Term  2   Preterm      AB  1   Living  2      SAB  1   IAB      Ectopic      Multiple      Live Births   2            Home Medications    Prior to Admission medications   Medication Sig Start Date End Date Taking? Authorizing Provider  nitrofurantoin, macrocrystal-monohydrate, (MACROBID) 100 MG capsule Take 1 capsule (100 mg total) by mouth 2 (two) times daily. 11/09/22  Yes Eustace Moore, MD  WEGOVY 0.25 MG/0.5ML SOAJ Inject 0.25 mg into the skin. 08/10/22  Yes [provider]  ALPRAZolam Prudy Feeler) 0.5 MG tablet Take 1 tablet (0.5 mg total) by mouth 2 (two) times daily as needed for anxiety. 02/07/21   Thresa Ross, MD  amphetamine-dextroamphetamine (ADDERALL) 10 MG tablet Take 1 tablet (10 mg total) by mouth daily. 12/31/21 03/01/22  Myrlene Broker, MD  citalopram (CELEXA) 10 MG tablet Take one a day 11/07/21   Thresa Ross, MD  fluticasone [facility]) 50 MCG/ACT nasal spray One spray in each nostril twice a day, use left hand for right nostril, and right hand for left nostril. 09/20/16   Monica Becton, MD  Multiple Vitamin (MULTIVITAMIN) tablet Take 1 tablet by mouth daily.    [provider]  FLUoxetine (PROZAC) 20 MG tablet Take 1 tablet (20 mg total) by mouth daily. Patient not taking: Reported on 03/17/2015 03/06/15 03/17/15  Jomarie Longs, PA-C  Family History Family History  Problem Relation Age of Onset   Hyperlipidemia Mother    Depression Mother    Anxiety disorder Mother    Diabetes Father    Hyperlipidemia Father    Hypertension Father    Parkinson's disease Father    Alcohol abuse Paternal Uncle    Heart attack Paternal Grandfather     Social History Social History   Tobacco Use   Smoking status: Some Days    Current packs/day: 0.50    Types: Cigarettes   Smokeless tobacco: Never  Vaping Use   Vaping status: Never Used  Substance Use Topics   Alcohol use: No    Alcohol/week: 2.0 standard drinks of alcohol    Types: 2 Glasses of wine per week    Comment: Every couple of weeks   Drug use: No     Allergies   Codeine,  Nifedipine, Penicillins, and Sulfamethoxazole   Review of Systems Review of Systems See HPI  Physical Exam Triage Vital Signs ED Triage Vitals  Encounter Vitals Group     BP 11/09/22 0900 (!) 136/93     Systolic BP Percentile --      Diastolic BP Percentile --      Pulse Rate 11/09/22 0900 99     Resp 11/09/22 0900 16     Temp 11/09/22 0900 98.2 F (36.8 C)     Temp src --      SpO2 11/09/22 0900 98 %     Weight --      Height --      Head Circumference --      Peak Flow --      Pain Score 11/09/22 0858 7     Pain Loc --      Pain Education --      Exclude from Growth Chart --    No data found.  Updated Vital Signs BP (!) 136/93   Pulse 99   Temp 98.2 F (36.8 C)   Resp 16   LMP 10/23/2022 (Approximate)   SpO2 98%       Physical Exam Constitutional:      General: She is not in acute distress.    Appearance: She is well-developed.  HENT:     Head: Normocephalic and atraumatic.  Eyes:     Conjunctiva/sclera: Conjunctivae normal.     Pupils: Pupils are equal, round, and reactive to light.  Cardiovascular:     Rate and Rhythm: Normal rate.  Pulmonary:     Effort: Pulmonary effort is normal. No respiratory distress.  Abdominal:     General: There is no distension.     Palpations: Abdomen is soft.     Tenderness: There is no abdominal tenderness.  Musculoskeletal:        General: Normal range of motion.     Cervical back: Normal range of motion.  Skin:    General: Skin is warm and dry.  Neurological:     Mental Status: She is alert.      UC Treatments / Results  Labs (all labs ordered are listed, but only abnormal results are displayed) Labs Reviewed  POCT URINALYSIS DIP (MANUAL ENTRY) - Abnormal; Notable for the following components:      Result Value   Color, UA orange (*)    Blood, UA large (*)    Protein Ur, POC =30 (*)    Leukocytes, UA Small (1+) (*)    All other components within normal limits  URINE CULTURE  POCT  URINE PREGNANCY     EKG   Radiology No results found.  Procedures Procedures (including critical care time)  Medications Ordered in UC Medications - No data to display  Initial Impression / Assessment and Plan / UC Course  I have reviewed the triage vital signs and the nursing notes.  Pertinent labs & imaging results that were available during my care of the patient were reviewed by me and considered in my medical decision making (see chart for details).    Final Clinical Impressions(s) / UC Diagnoses   Final diagnoses:  Lower urinary tract infectious disease     Discharge Instructions      Take the macrobid ( nitrofurantoin) 2 x a day  You will be called if any change in antibiotics are needed    ED Prescriptions     Medication Sig Dispense Auth. Provider   nitrofurantoin, macrocrystal-monohydrate, (MACROBID) 100 MG capsule Take 1 capsule (100 mg total) by mouth 2 (two) times daily. 10 capsule Eustace Moore, MD      PDMP not reviewed this encounter.   Eustace Moore, MD 11/09/22 [phone]

## 2022-11-11 LAB — URINE CULTURE: Culture: 40000 — AB

## 2023-04-29 ENCOUNTER — Ambulatory Visit: Payer: Federal, State, Local not specified - PPO

## 2023-04-29 ENCOUNTER — Other Ambulatory Visit: Payer: Self-pay

## 2023-04-29 ENCOUNTER — Encounter: Payer: Self-pay | Admitting: Emergency Medicine

## 2023-04-29 ENCOUNTER — Ambulatory Visit
Admission: EM | Admit: 2023-04-29 | Discharge: 2023-04-29 | Disposition: A | Discharge status: Home / Self Care | Payer: Federal, State, Local not specified - PPO | Attending: Family Medicine | Admitting: Family Medicine

## 2023-04-29 DIAGNOSIS — R109 Unspecified abdominal pain: Secondary | ICD-10-CM | POA: Diagnosis not present

## 2023-04-29 DIAGNOSIS — N390 Urinary tract infection, site not specified: Secondary | ICD-10-CM | POA: Insufficient documentation

## 2023-04-29 DIAGNOSIS — R319 Hematuria, unspecified: Secondary | ICD-10-CM | POA: Diagnosis not present

## 2023-04-29 DIAGNOSIS — I10 Essential (primary) hypertension: Secondary | ICD-10-CM | POA: Insufficient documentation

## 2023-04-29 DIAGNOSIS — I700 Atherosclerosis of aorta: Secondary | ICD-10-CM | POA: Insufficient documentation

## 2023-04-29 DIAGNOSIS — F4310 Post-traumatic stress disorder, unspecified: Secondary | ICD-10-CM | POA: Diagnosis not present

## 2023-04-29 DIAGNOSIS — N809 Endometriosis, unspecified: Secondary | ICD-10-CM | POA: Diagnosis not present

## 2023-04-29 LAB — POCT URINALYSIS DIP (MANUAL ENTRY)
Bilirubin, UA: NEGATIVE
Glucose, UA: NEGATIVE mg/dL
Ketones, POC UA: NEGATIVE mg/dL
Leukocytes, UA: NEGATIVE
Nitrite, UA: NEGATIVE
Protein Ur, POC: NEGATIVE mg/dL
Spec Grav, UA: 1.015
Urobilinogen, UA: 0.2 U/dL
pH, UA: 6

## 2023-04-29 NOTE — ED Provider Notes (Signed)
[facility]    CSN: [id number] Arrival date & time: 04/29/23  1302      History   Chief Complaint Chief Complaint  Patient presents with   Hematuria    HPI Morgan Miller is a 43 y.o. female.   HPI 43 year old female presents with hematuria reports that she is currently taking Bactrim for 2 days for previous UTI.  Patient reports this morning she started urinating blood with bloating feeling.  PMH significant for endometriosis, PTSD, and HTN.  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   Moderate episode of recurrent major depressive disorder (HCC) 04/12/2015   Insomnia 03/07/2015   Emotional crisis, acute reaction to stress 03/07/2015   Overweight (BMI 25.0-29.9) 08/10/2014   Anxiety in acute stress reaction 08/10/2014   Miscarriage 08/09/2014   Abnormal pregnancy in first trimester 07/26/2014   Pre-diabetes 06/29/2013   Essential hypertension-postpartum 06/21/2013   Abnormal weight gain 06/21/2013   BMI 29.0-29.9,adult 06/21/2013   Endometriosis 08/30/2009    Past Surgical History:  Procedure Laterality Date   CESAREAN SECTION      OB History     Gravida  5   Para  2   Term  2   Preterm      AB  1   Living  2      SAB  1   IAB      Ectopic      Multiple      Live Births  2            Home Medications    Prior to Admission medications    Medication Sig Start Date End Date Taking? Authorizing Provider  Multiple Vitamin (MULTIVITAMIN) tablet Take 1 tablet by mouth daily.   Yes [provider]  ALPRAZolam (XANAX) 0.5 MG tablet Take 1 tablet (0.5 mg total) by mouth 2 (two) times daily as needed for anxiety. 02/07/21   Thresa Ross, MD  amphetamine-dextroamphetamine (ADDERALL) 10 MG tablet Take 1 tablet (10 mg total) by mouth daily. 12/31/21 03/01/22  Myrlene Broker, MD  citalopram (CELEXA) 10 MG tablet Take one a day 11/07/21   Thresa Ross, MD  fluticasone [facility]) 50 MCG/ACT nasal spray One spray in each nostril twice a day, use left hand for right nostril, and right hand for left nostril. 09/20/16   Monica Becton, MD  nitrofurantoin, macrocrystal-monohydrate, (MACROBID) 100 MG capsule Take 1 capsule (100 mg total) by mouth 2 (two) times daily. 11/09/22   Eustace Moore, MD  WEGOVY 0.25 MG/0.5ML SOAJ Inject 0.25 mg into the skin. 08/10/22   [provider]  FLUoxetine (PROZAC) 20 MG tablet Take 1 tablet (20 mg total) by mouth daily. Patient not taking: Reported on 03/17/2015 03/06/15 03/17/15  Jomarie Longs, PA-C    Family History Family History  Problem Relation Age of Onset   Hyperlipidemia Mother    Depression Mother    Anxiety disorder  Mother    Diabetes Father    Hyperlipidemia Father    Hypertension Father    Parkinson's disease Father    Heart attack Paternal Grandfather    Alcohol abuse Paternal Uncle     Social History Social History   Tobacco Use   Smoking status: Every Day    Current packs/day: 0.50    Types: Cigarettes   Smokeless tobacco: Never  Vaping Use   Vaping status: Never Used  Substance Use Topics   Alcohol use: No    Alcohol/week: 2.0 standard drinks of alcohol    Types: 2 Glasses of wine per week    Comment: Every couple of weeks   Drug use: No     Allergies   Codeine and Nifedipine   Review of Systems Review of Systems  Genitourinary:  Positive  for hematuria.  All other systems reviewed and are negative.    Physical Exam Triage Vital Signs ED Triage Vitals  Encounter Vitals Group     BP 04/29/23 1410 137/89     Systolic BP Percentile --      Diastolic BP Percentile --      Pulse Rate 04/29/23 1410 79     Resp 04/29/23 1410 16     Temp 04/29/23 1410 98.7 F (37.1 C)     Temp Source 04/29/23 1410 Oral     SpO2 04/29/23 1410 100 %     Weight 04/29/23 1412 150 lb (68 kg)     Height 04/29/23 1412 5\' 7"  (1.702 m)     Head Circumference --      Peak Flow --      Pain Score 04/29/23 1412 0     Pain Loc --      Pain Education --      Exclude from Growth Chart --    No data found.  Updated Vital Signs BP 137/89 (BP Location: Right Arm)   Pulse 79   Temp 98.7 F (37.1 C) (Oral)   Resp 16   Ht 5\' 7"  (1.702 m)   Wt 150 lb (68 kg)   LMP 03/31/2023 (Approximate)   SpO2 100%   Breastfeeding No   BMI 23.49 kg/m   Visual Acuity Right Eye Distance:   Left Eye Distance:   Bilateral Distance:    Right Eye Near:   Left Eye Near:    Bilateral Near:     Physical Exam Vitals and nursing note reviewed.  Constitutional:      Appearance: Normal appearance. She is normal weight. She is not ill-appearing.  HENT:     Head: Normocephalic and atraumatic.     Mouth/Throat:     Mouth: Mucous membranes are moist.     Pharynx: Oropharynx is clear.  Eyes:     Extraocular Movements: Extraocular movements intact.     Conjunctiva/sclera: Conjunctivae normal.     Pupils: Pupils are equal, round, and reactive to light.  Cardiovascular:     Rate and Rhythm: Normal rate and regular rhythm.     Pulses: Normal pulses.     Heart sounds: Normal heart sounds.  Pulmonary:     Effort: Pulmonary effort is normal.     Breath sounds: Normal breath sounds. No wheezing, rhonchi or rales.  Musculoskeletal:        General: Normal range of motion.     Cervical back: Normal range of motion and neck supple.  Skin:    General: Skin is warm and  dry.  Neurological:  General: No focal deficit present.     Mental Status: She is alert and oriented to person, place, and time. Mental status is at baseline.  Psychiatric:        Mood and Affect: Mood normal.        Behavior: Behavior normal.      UC Treatments / Results  Labs (all labs ordered are listed, but only abnormal results are displayed) Labs Reviewed  POCT URINALYSIS DIP (MANUAL ENTRY) - Abnormal; Notable for the following components:      Result Value   Color, UA other (*)    Blood, UA large (*)    All other components within normal limits  URINE CULTURE    EKG   Radiology CT Renal Stone Study Result Date: 04/29/2023 CLINICAL DATA:  Hematuria.  Currently being treated for UTI. EXAM: CT ABDOMEN AND PELVIS WITHOUT CONTRAST TECHNIQUE: Multidetector CT imaging of the abdomen and pelvis was performed following the standard protocol without IV contrast. RADIATION DOSE REDUCTION: This exam was performed according to the departmental dose-optimization program which includes automated exposure control, adjustment of the mA and/or kV according to patient size and/or use of iterative reconstruction technique. COMPARISON:  None Available. FINDINGS: Lower chest: No acute abnormality. Hepatobiliary: No focal liver abnormality is seen. No gallstones, gallbladder wall thickening, or biliary dilatation. Pancreas: Unremarkable. No pancreatic ductal dilatation or surrounding inflammatory changes. Spleen: Normal in size without focal abnormality. Adrenals/Urinary Tract: Adrenal glands are unremarkable. Kidneys are normal, without renal calculi, focal lesion, or hydronephrosis. Bladder is unremarkable. Stomach/Bowel: Stomach is within normal limits. Appendix appears normal. No evidence of bowel wall thickening, distention, or inflammatory changes. Vascular/Lymphatic: Aortic atherosclerosis. No enlarged abdominal or pelvic lymph nodes. Reproductive: Uterus and bilateral adnexa are unremarkable.  Other: No abdominal wall hernia or abnormality. No abdominopelvic ascites. No pneumoperitoneum. Musculoskeletal: No acute or significant osseous findings. IMPRESSION: 1. No acute intra-abdominal process. No urolithiasis or hydronephrosis. 2.  Aortic Atherosclerosis (ICD10-I70.0). Electronically Signed   By: Obie Dredge M.D.   On: 04/29/2023 15:43    Procedures Procedures (including critical care time)  Medications Ordered in UC Medications - No data to display  Initial Impression / Assessment and Plan / UC Course  I have reviewed the triage vital signs and the nursing notes.  Pertinent labs & imaging results that were available during my care of the patient were reviewed by me and considered in my medical decision making (see chart for details).   MDM: 1.  Hematuria, unspecified-UA reveals above, urine culture ordered, patient is currently on Bactrim twice daily x 7 days from another UC; 2.  Bilateral flank pain-CT of renal pelvis revealed above patient advised. Advised patient of CT renal stone study results with hardcopy provided.  Advised patient to continue previously prescribed Bactrim for urinary tract infection.  Encouraged to increase daily water intake to 64 ounces per day.  Advised we will follow-up with urine culture results once received.  Advised if symptoms worsen and/or unresolved please follow-up with your PCP or [facility] urology for further evaluation.  Contact information provided with his AVS today. Final Clinical Impressions(s) / UC Diagnoses   Final diagnoses:  Hematuria, unspecified type  Bilateral flank pain     Discharge Instructions      Advised patient of CT renal stone study results with hardcopy provided.  Advised patient to continue previously prescribed Bactrim for urinary tract infection.  Encouraged to increase daily water intake to 64 ounces per day.  Advised we will follow-up with  urine culture results once received.  Advised if symptoms worsen  and/or unresolved please follow-up with your PCP or [facility] urology for further evaluation.  Contact information provided with his AVS today.     ED Prescriptions   None    PDMP not reviewed this encounter.   Trevor Iha, FNP 04/29/23 1606

## 2023-04-29 NOTE — ED Triage Notes (Signed)
Patient states that she is currently being treated for a UTI w/Bactrim x 2 days.  This morning, she started urinating blood, afebrile, bloating feeling.  Patient has taken Ibuprofen.

## 2023-04-29 NOTE — Discharge Instructions (Addendum)
Advised patient of CT renal stone study results with hardcopy provided.  Advised patient to continue previously prescribed Bactrim for urinary tract infection.  Encouraged to increase daily water intake to 64 ounces per day.  Advised we will follow-up with urine culture results once received.  Advised if symptoms worsen and/or unresolved please follow-up with your PCP or [facility] urology for further evaluation.  Contact information provided with his AVS today.

## 2023-04-30 LAB — URINE CULTURE: Culture: NO GROWTH

## 2023-06-05 DIAGNOSIS — F909 Attention-deficit hyperactivity disorder, unspecified type: Secondary | ICD-10-CM | POA: Diagnosis not present

## 2023-07-25 DIAGNOSIS — L700 Acne vulgaris: Secondary | ICD-10-CM | POA: Diagnosis not present

## 2023-08-28 DIAGNOSIS — F909 Attention-deficit hyperactivity disorder, unspecified type: Secondary | ICD-10-CM | POA: Diagnosis not present

## 2023-11-13 DIAGNOSIS — F909 Attention-deficit hyperactivity disorder, unspecified type: Secondary | ICD-10-CM | POA: Diagnosis not present

## 2023-12-18 DIAGNOSIS — E663 Overweight: Secondary | ICD-10-CM | POA: Diagnosis not present

## 2023-12-18 DIAGNOSIS — Z5181 Encounter for therapeutic drug level monitoring: Secondary | ICD-10-CM | POA: Diagnosis not present

## 2023-12-18 DIAGNOSIS — E119 Type 2 diabetes mellitus without complications: Secondary | ICD-10-CM | POA: Diagnosis not present

## 2023-12-18 DIAGNOSIS — Z713 Dietary counseling and surveillance: Secondary | ICD-10-CM | POA: Diagnosis not present

## 2023-12-28 LAB — LAB REPORT - SCANNED
A1c: 5.7
EGFR: 113

## 2024-02-09 DIAGNOSIS — F909 Attention-deficit hyperactivity disorder, unspecified type: Secondary | ICD-10-CM | POA: Diagnosis not present

## 2024-02-23 ENCOUNTER — Telehealth: Payer: Self-pay | Admitting: Physician Assistant

## 2024-02-23 NOTE — Telephone Encounter (Signed)
 Ok to schedule.

## 2024-02-23 NOTE — Telephone Encounter (Signed)
 Copied from CRM #[id number]. Topic: Appointments - Scheduling Inquiry for Clinic >> Feb 23, 2024 10:56 AM Winona R wrote: Pt would like to establish care with [facility]. It seems the Pt was only seen for emergency medicine and the system is generating a new pt appointment for her.  P{t does not want to schedule with new provider and would like to ask [facility] if she will accept her.

## 2024-03-09 ENCOUNTER — Encounter: Payer: Self-pay | Admitting: Physician Assistant

## 2024-03-09 ENCOUNTER — Ambulatory Visit: Admitting: Physician Assistant

## 2024-03-09 VITALS — BP 138/70 | HR 85 | Ht 67.0 in | Wt 157.0 lb

## 2024-03-09 DIAGNOSIS — R635 Abnormal weight gain: Secondary | ICD-10-CM | POA: Diagnosis not present

## 2024-03-09 DIAGNOSIS — Z8639 Personal history of other endocrine, nutritional and metabolic disease: Secondary | ICD-10-CM

## 2024-03-09 DIAGNOSIS — R7303 Prediabetes: Secondary | ICD-10-CM | POA: Diagnosis not present

## 2024-03-09 DIAGNOSIS — Z1231 Encounter for screening mammogram for malignant neoplasm of breast: Secondary | ICD-10-CM

## 2024-03-09 DIAGNOSIS — F900 Attention-deficit hyperactivity disorder, predominantly inattentive type: Secondary | ICD-10-CM | POA: Diagnosis not present

## 2024-03-09 DIAGNOSIS — Z124 Encounter for screening for malignant neoplasm of cervix: Secondary | ICD-10-CM

## 2024-03-09 DIAGNOSIS — R030 Elevated blood-pressure reading, without diagnosis of hypertension: Secondary | ICD-10-CM | POA: Insufficient documentation

## 2024-03-09 DIAGNOSIS — Z7689 Persons encountering health services in other specified circumstances: Secondary | ICD-10-CM

## 2024-03-09 MED ORDER — WEGOVY 0.25 MG/0.5ML ~~LOC~~ SOAJ: 0.2500 mg | SUBCUTANEOUS | 0 refills | Status: DC

## 2024-03-09 MED ORDER — WEGOVY 0.5 MG/0.5ML ~~LOC~~ SOAJ: 0.5000 mg | SUBCUTANEOUS | 0 refills | Status: DC

## 2024-03-09 NOTE — Patient Instructions (Addendum)
 Mammogram ordered.  Referral to GYN next door.  Reviewed labs and will abstract them.   [facility] [address] [place]  [phone] Open  Closes 6?PM I like that they answer the phone promptly.

## 2024-03-09 NOTE — Progress Notes (Unsigned)
 New Patient Office Visit  Subjective    Patient ID: Morgan Miller, female    DOB: 1980/06/09  Age: 43 y.o. MRN: [id number]  CC:  Chief Complaint  Patient presents with   Establish Care    Wants to restart wegovy    HPI .SABRADiscussed the use of AI scribe software for clinical note transcription with the patient, who gave verbal consent to proceed.  History of Present Illness Morgan Miller is a 43 year old female who presents to the clinic to establish care and discuss weight gain and increased appetite.  Weight gain and increased appetite - Significant increase in appetite and weight gain over the past two months - Weight increased from 145 pounds to 157 pounds during this period - Previously lost weight from 177 pounds to 145 pounds while on Wegovy, which was discontinued about a year ago - Attributes some recent changes to discontinuing caffeine - Feels weight gain has contributed to her symptoms and recalls feeling better at 145 pounds  Glycemic status - Recent laboratory evaluation revealed a prediabetic A1c level of 5.7.   Blood pressure monitoring - Home blood pressure monitoring showed slightly elevated readings, which have decreased recently  Physical activity - Recently joined a gym to increase physical activity  Fluid retention and edema - Feels puffy, particularly around her menstrual period - Aware of the need to monitor sodium intake to manage fluid retention  Cardiopulmonary symptoms - No chest pain or shortness of breath  Thyroid  function - Recent thyroid  function testing was normal  She had labs drawn through [facility].     Outpatient Encounter Medications as of 03/09/2024  Medication Sig   WEGOVY 0.25 MG/0.5ML SOAJ SQ injection Inject 0.25 mg into the skin once a week. Use this dose for 1 month (4 shots) and then increase to next higher dose.   [START ON 04/08/2024] WEGOVY 0.5 MG/0.5ML SOAJ SQ injection Inject 0.5 mg into the skin once a  week. Use this dose for 1 month (4 shots) and then increase to next higher dose.   amphetamine -dextroamphetamine  (ADDERALL) 10 MG tablet Take 10 mg by mouth daily with breakfast.   Clindamycin-Benzoyl Per, Refr, gel Apply topically 2 (two) times daily.   fluticasone  (FLONASE ) 50 MCG/ACT nasal spray One spray in each nostril twice a day, use left hand for right nostril, and right hand for left nostril.   Multiple Vitamin (MULTIVITAMIN) tablet Take 1 tablet by mouth daily.   [DISCONTINUED] ALPRAZolam  (XANAX ) 0.5 MG tablet Take 1 tablet (0.5 mg total) by mouth 2 (two) times daily as needed for anxiety.   [DISCONTINUED] amphetamine -dextroamphetamine  (ADDERALL) 10 MG tablet Take 1 tablet (10 mg total) by mouth daily.   [DISCONTINUED] citalopram  (CELEXA ) 10 MG tablet Take one a day   [DISCONTINUED] FLUoxetine  (PROZAC ) 20 MG tablet Take 1 tablet (20 mg total) by mouth daily. (Patient not taking: Reported on 03/17/2015)   [DISCONTINUED] nitrofurantoin , macrocrystal-monohydrate, (MACROBID ) 100 MG capsule Take 1 capsule (100 mg total) by mouth 2 (two) times daily.   [DISCONTINUED] WEGOVY 0.25 MG/0.5ML SOAJ Inject 0.25 mg into the skin.   No facility-administered encounter medications on file as of 03/09/2024.    Past Medical History:  Diagnosis Date   Complication of anesthesia    epidural caused syncope   Hypertension    Preeclampsia in postpartum period     Past Surgical History:  Procedure Laterality Date   CESAREAN SECTION      Family History  Problem Relation Age of Onset  Hyperlipidemia Mother    Depression Mother    Anxiety disorder Mother    Diabetes Father    Hyperlipidemia Father    Hypertension Father    Parkinson's disease Father    Heart attack Paternal Grandfather    Alcohol abuse Paternal Uncle     Social History   Socioeconomic History   Marital status: Married    Spouse name: Not on file   Number of children: Not on file   Years of education: Not on file    Highest education level: Not on file  Occupational History   Occupation: supervisor  Tobacco Use   Smoking status: Every Day    Current packs/day: 0.50    Types: Cigarettes   Smokeless tobacco: Never  Vaping Use   Vaping status: Never Used  Substance and Sexual Activity   Alcohol use: No    Alcohol/week: 2.0 standard drinks of alcohol    Types: 2 Glasses of wine per week    Comment: Every couple of weeks   Drug use: No   Sexual activity: Yes    Partners: Male    Birth control/protection: None  Other Topics Concern   Not on file  Social History Narrative   Not on file   Social Drivers of Health   Financial Resource Strain: Not on file  Food Insecurity: Not on file  Transportation Needs: Not on file  Physical Activity: Not on file  Stress: Not on file  Social Connections: Not on file  Intimate Partner Violence: Not on file    ROS See HPI.    Objective    BP 138/70   Pulse 85   Ht 5' 7 (1.702 m)   Wt 157 lb (71.2 kg)   SpO2 99%   BMI 24.59 kg/m   Physical Exam Constitutional:      Appearance: Normal appearance.  HENT:     Head: Normocephalic.  Cardiovascular:     Rate and Rhythm: Normal rate.  Pulmonary:     Effort: Pulmonary effort is normal.  Neurological:     General: No focal deficit present.     Mental Status: She is alert.  Psychiatric:        Mood and Affect: Mood normal.        Assessment & Plan:  .SABRAJocelyn was seen today for establish care.  Diagnoses and all orders for this visit:  Encounter to establish care  Encounter for screening mammogram for malignant neoplasm of breast -     MM 3D SCREENING MAMMOGRAM BILATERAL BREAST  Cervical cancer screening -     Ambulatory referral to Obstetrics / Gynecology  Pre-diabetes -     WEGOVY 0.25 MG/0.5ML SOAJ SQ injection; Inject 0.25 mg into the skin once a week. Use this dose for 1 month (4 shots) and then increase to next higher dose. -     WEGOVY 0.5 MG/0.5ML SOAJ SQ injection; Inject  0.5 mg into the skin once a week. Use this dose for 1 month (4 shots) and then increase to next higher dose.  Elevated blood pressure reading  History of obesity -     WEGOVY 0.25 MG/0.5ML SOAJ SQ injection; Inject 0.25 mg into the skin once a week. Use this dose for 1 month (4 shots) and then increase to next higher dose. -     WEGOVY 0.5 MG/0.5ML SOAJ SQ injection; Inject 0.5 mg into the skin once a week. Use this dose for 1 month (4 shots) and then increase to next higher  dose.  Abnormal weight gain -     WEGOVY 0.25 MG/0.5ML SOAJ SQ injection; Inject 0.25 mg into the skin once a week. Use this dose for 1 month (4 shots) and then increase to next higher dose. -     WEGOVY 0.5 MG/0.5ML SOAJ SQ injection; Inject 0.5 mg into the skin once a week. Use this dose for 1 month (4 shots) and then increase to next higher dose.  Attention deficit hyperactivity disorder (ADHD), predominantly inattentive type   Assessment & Plan Prediabetes A1c levels indicate prediabetes. Insurance covers [employer], but not [employer] or [employer]ep[employer]. Discussed alternative options, compounded tirzepatide from [facility]. Morgan Miller is covered under tier two for FEP in 2026, but not currently. Compounded tirzepatide is available at a lower cost. Discussed the efficacy and tolerability of tirzepatide compared to semaglutide. She prefers to start with Wegovy at a lower dose due to previous experience. - Sent prescription for [facility] starting at 0.25 mg, then increase to 0.5 mg as tolerated. - Consider compounded tirzepatide if Morgan Miller is not covered by insurance. - Monitor weight and adjust medication dosage as needed.  Elevated blood pressure without diagnosis of hypertension Blood pressure is elevated but not diagnosed as hypertension. Weight loss is expected to improve blood pressure. Sodium intake and fluid retention may contribute to elevated blood pressure. - Rechecked blood pressure. - Encouraged weight loss through lifestyle  changes. - Advised on reducing sodium intake to manage fluid retention.  General Health Maintenance Discussed the importance of exercise and lifestyle changes in managing weight and blood pressure. She has recently obtained a gym membership. - Encouraged regular exercise and healthy lifestyle changes. - Mammogram ordered.  - Referral for GYN for pap smear.  - Reviewed labs from [facility] which looked good other than pre-diabetes. Will have them abstracted into EMR.   ADHD - on Adderall and doing well.      Return in about 3 months (around 06/07/2024), or if symptoms worsen or fail to improve.   Yetta Marceaux, PA-C

## 2024-03-10 ENCOUNTER — Encounter: Payer: Self-pay | Admitting: Physician Assistant

## 2024-03-10 DIAGNOSIS — Z8639 Personal history of other endocrine, nutritional and metabolic disease: Secondary | ICD-10-CM | POA: Insufficient documentation

## 2024-03-15 ENCOUNTER — Telehealth: Payer: Self-pay

## 2024-03-15 ENCOUNTER — Other Ambulatory Visit (HOSPITAL_COMMUNITY): Payer: Self-pay

## 2024-03-15 NOTE — Telephone Encounter (Signed)
 Pharmacy Patient Advocate Encounter   Received notification from Onbase that prior authorization for Wegovy  0.25MG /0.5ML auto-injectors  is required/requested.   Insurance verification completed.   The patient is insured through [facility].   Per test claim: Per test claim, medication is not covered due to plan/benefit exclusion, PA not submitted at this time

## 2024-03-15 NOTE — Telephone Encounter (Signed)
 Copied from CRM [id number]. Topic: Clinical - Prescription Issue >> Mar 12, 2024  4:25 PM Fonda T wrote: Reason for CRM: Pt calling states pharmacy advised that paperwork for completion for non-formulary medication, Wegovy , was sent to office for completion.  Per pt states pharmacy has not received forms back.   Pt calling to check status of form.  Pt can be reached back at [phone].  [facility] - [place], [place] - [address] [place] [address] Phone: [phone] Fax: [phone]

## 2024-04-07 ENCOUNTER — Other Ambulatory Visit: Payer: Self-pay | Admitting: Physician Assistant

## 2024-04-07 DIAGNOSIS — R7303 Prediabetes: Secondary | ICD-10-CM

## 2024-04-07 DIAGNOSIS — Z8639 Personal history of other endocrine, nutritional and metabolic disease: Secondary | ICD-10-CM

## 2024-04-07 DIAGNOSIS — R635 Abnormal weight gain: Secondary | ICD-10-CM

## 2024-04-07 NOTE — Telephone Encounter (Signed)
 Copied from CRM [id number]. Topic: Clinical - Medication Refill >> Apr 07, 2024  8:46 AM Wess S wrote: Medication: WEGOVY  0.5 MG/0.5ML SOAJ SQ injection (Starting on 04/08/2024)   Has the patient contacted their pharmacy? No (Agent: If no, request that the patient contact the pharmacy for the refill. If patient does not wish to contact the pharmacy document the reason why and proceed with request.) (Agent: If yes, when and what did the pharmacy advise?)  This is the patient's preferred pharmacy:  [phone] [place], [place] - [address] [place] [address] Phone: [phone] Fax: [phone]   Is this the correct pharmacy for this prescription? Yes If no, delete pharmacy and type the correct one.   Has the prescription been filled recently? Yes  Is the patient out of the medication? Yes  Has the patient been seen for an appointment in the last year OR does the patient have an upcoming appointment? Yes  Can we respond through MyChart? Yes  Agent: Please be advised that Rx refills may take up to 3 business days. We ask that you follow-up with your pharmacy.

## 2024-04-09 ENCOUNTER — Telehealth: Payer: Self-pay

## 2024-04-09 ENCOUNTER — Other Ambulatory Visit: Payer: Self-pay

## 2024-04-09 ENCOUNTER — Other Ambulatory Visit: Payer: Self-pay | Admitting: Physician Assistant

## 2024-04-09 DIAGNOSIS — R635 Abnormal weight gain: Secondary | ICD-10-CM

## 2024-04-09 DIAGNOSIS — Z8639 Personal history of other endocrine, nutritional and metabolic disease: Secondary | ICD-10-CM

## 2024-04-09 DIAGNOSIS — R7303 Prediabetes: Secondary | ICD-10-CM

## 2024-04-09 MED ORDER — WEGOVY 0.5 MG/0.5ML ~~LOC~~ SOAJ: 0.5000 mg | SUBCUTANEOUS | 0 refills | Status: DC

## 2024-04-09 NOTE — Telephone Encounter (Signed)
 Copied from CRM [id number]. Topic: Clinical - Prescription Issue >> Apr 09, 2024 10:19 AM Mercer PEDLAR wrote: Reason for CRM: Patient is requesting to send WEGOVY  0.5 MG/0.5ML SOAJ SQ [facility]  [phone] [place], [place] - [address]  [place] [address] Phone: [phone] Fax: [phone]

## 2024-04-09 NOTE — Telephone Encounter (Signed)
 Will call pharmacy to check on this on Monday 04/12/2024

## 2024-04-09 NOTE — Telephone Encounter (Signed)
 FYI Spoke with [facility] and was told that the Wegovy  0.5mg  had been transferred out by patient along with 0.25mg  sent on 03/09/2024.  Called [facility] [address] [place] and they state that they did receive the 0.25mg  but not the 0.5mg   Wegovy  0.5mg  resent to [facility] [place].

## 2024-04-09 NOTE — Telephone Encounter (Signed)
 Copied from CRM [id number]. Topic: Clinical - Prescription Issue >> Apr 09, 2024 10:19 AM Mercer PEDLAR wrote: Reason for CRM: Patient is requesting to send WEGOVY  0.5 MG/0.5ML SOAJ SQ [facility].  [id number] [place], [place] - [address] [place] [address] Phone: [phone] Fax: [phone] >> Apr 09, 2024  3:22 PM Larissa RAMAN wrote: Patient states pharmacy informed her that  instructions for prescription are unclear and they can not refill medication. Patient is requesting to have prescription resent and a callback once completed.  >> Apr 09, 2024  3:10 PM Larissa S wrote: Patient calling to check status of refill. Advised that prescription has been sent to her pharmacy.

## 2024-04-12 ENCOUNTER — Telehealth: Payer: Self-pay

## 2024-04-12 NOTE — Telephone Encounter (Signed)
 Received PA notification as below:  03/15/24  2:21 PM Note Pharmacy Patient Advocate Encounter   Received notification from Onbase that prior authorization for Wegovy  0.25MG /0.5ML auto-injectors  is required/requested.   Insurance verification completed.   The patient is insured through [facility].   Per test claim: Per test claim, medication is not covered due to plan/benefit exclusion, PA not submitted at this time       Forwarding to Morgan Miller, [place] -  Patient informed as above and is wanting to know what options she has since Wegovy  not covered.

## 2024-04-12 NOTE — Telephone Encounter (Signed)
 Spoke with pharmacy and was told that the Wegovy  prescription  was not covered by insurance. Neither as 84  or 28 day supply.  PA results in patient chart  also show that the medication is not covered by insurance.  Did you want to change the medication?

## 2024-04-12 NOTE — Telephone Encounter (Signed)
 Separate note in patient chart regarding conversation with pharmacy and patient. Forwarded the message to Vermell Bologna for review

## 2024-04-12 NOTE — Telephone Encounter (Signed)
 Copied from CRM [id number]. Topic: Clinical - Prescription Issue >> Apr 12, 2024 11:19 AM Treva T wrote: Reason for CRM: Pt calling to get status of medication refill update on medication, WEGOVY  0.5 MG/0.5ML SOAJ SQ injection.  Per pt states pharmacy advised that a PA is needed for medication.  Pt states she needs this done as soon as possible as next injection is due on tomorrow.   Pt is requesting a follow up call at [phone].  Pt is aware of same day call back.   [phone] [place], [place] - [address] [place] [address] Phone: [phone] Fax: [phone]

## 2024-04-13 NOTE — Telephone Encounter (Signed)
 Patient is interested in the Compounded Version but stated that she wants it for next month since she has a coupon for $200 that is only valid for this month. She also wants to know if the injections are daily or weekly.

## 2024-04-13 NOTE — Telephone Encounter (Signed)
 Injections are weekly. Prescription has been sent.

## 2024-04-29 ENCOUNTER — Ambulatory Visit (HOSPITAL_BASED_OUTPATIENT_CLINIC_OR_DEPARTMENT_OTHER)
Admission: RE | Admit: 2024-04-29 | Discharge: 2024-04-29 | Disposition: A | Discharge status: Home / Self Care | Source: Ambulatory Visit | Attending: Physician Assistant | Admitting: Physician Assistant

## 2024-04-29 ENCOUNTER — Encounter (HOSPITAL_BASED_OUTPATIENT_CLINIC_OR_DEPARTMENT_OTHER): Payer: Self-pay

## 2024-04-29 DIAGNOSIS — Z1231 Encounter for screening mammogram for malignant neoplasm of breast: Secondary | ICD-10-CM | POA: Diagnosis present

## 2024-04-30 ENCOUNTER — Telehealth: Payer: Self-pay

## 2024-04-30 ENCOUNTER — Ambulatory Visit: Payer: Self-pay | Admitting: Physician Assistant

## 2024-04-30 MED ORDER — TIRZEPATIDE 10 MG/0.5ML ~~LOC~~ SOAJ: SUBCUTANEOUS | 11 refills | Status: DC

## 2024-04-30 NOTE — Telephone Encounter (Signed)
 What dose is she add no we could transition her to approximately the same dose in compounded.

## 2024-04-30 NOTE — Progress Notes (Signed)
 Normal mammogram. Follow up in 1 year.

## 2024-04-30 NOTE — Telephone Encounter (Signed)
 Start 75 units weekly then increase as tolerated and needed every 4 weeks. If losing weight would stay at lower dosages.

## 2024-04-30 NOTE — Telephone Encounter (Signed)
 Morgan Miller would like to switch to the compounding tirzepatide. She just wants to know if she would have to start over at the low dose.

## 2024-05-03 ENCOUNTER — Other Ambulatory Visit: Payer: Self-pay | Admitting: Physician Assistant

## 2024-05-03 NOTE — Telephone Encounter (Signed)
 Left message for a return call

## 2024-05-04 MED ORDER — MOUNJARO 2.5 MG/0.5ML ~~LOC~~ SOAJ: 2.5000 mg | SUBCUTANEOUS | 0 refills | Status: AC

## 2024-05-04 NOTE — Addendum Note (Signed)
 Addended by: ANTONIETTE VERMELL CROME on: 05/04/2024 03:36 PM   Modules accepted: Orders

## 2024-05-12 ENCOUNTER — Ambulatory Visit: Admitting: Physician Assistant

## 2024-05-12 ENCOUNTER — Encounter: Payer: Self-pay | Admitting: Physician Assistant

## 2024-05-12 VITALS — BP 118/86 | HR 67 | Ht 67.0 in | Wt 153.0 lb

## 2024-05-12 DIAGNOSIS — Z8639 Personal history of other endocrine, nutritional and metabolic disease: Secondary | ICD-10-CM

## 2024-05-12 DIAGNOSIS — Z0000 Encounter for general adult medical examination without abnormal findings: Secondary | ICD-10-CM

## 2024-05-12 DIAGNOSIS — R635 Abnormal weight gain: Secondary | ICD-10-CM

## 2024-05-12 DIAGNOSIS — R7303 Prediabetes: Secondary | ICD-10-CM

## 2024-05-12 MED ORDER — MOUNJARO 7.5 MG/0.5ML ~~LOC~~ SOAJ: 7.5000 mg | SUBCUTANEOUS | 0 refills | Status: AC

## 2024-05-12 MED ORDER — MOUNJARO 5 MG/0.5ML ~~LOC~~ SOAJ: 5.0000 mg | SUBCUTANEOUS | 0 refills | Status: AC

## 2024-05-14 MED ORDER — AMOXICILLIN-POT CLAVULANATE 875-125 MG PO TABS: 1.0000 | ORAL_TABLET | Freq: Two times a day (BID) | ORAL | 0 refills | Status: AC

## 2024-06-07 ENCOUNTER — Ambulatory Visit: Admitting: Physician Assistant

## 2024-08-11 ENCOUNTER — Ambulatory Visit: Admitting: Physician Assistant
